# Patient Record
Sex: Male | Born: 1970 | Race: White | Hispanic: No | Smoking: Never smoker
Health system: Southern US, Community
[De-identification: ages and names within clinical notes are randomized; demographics above are authoritative.]

## PROBLEM LIST (undated history)

## (undated) DIAGNOSIS — I10 Essential (primary) hypertension: Secondary | ICD-10-CM

## (undated) DIAGNOSIS — D649 Anemia, unspecified: Secondary | ICD-10-CM

## (undated) DIAGNOSIS — K439 Ventral hernia without obstruction or gangrene: Secondary | ICD-10-CM

## (undated) DIAGNOSIS — G4700 Insomnia, unspecified: Secondary | ICD-10-CM

## (undated) DIAGNOSIS — M1990 Unspecified osteoarthritis, unspecified site: Secondary | ICD-10-CM

## (undated) HISTORY — DX: Essential (primary) hypertension: I10

## (undated) HISTORY — PX: COLONOSCOPY: SHX174

---

## 1998-02-27 HISTORY — PX: BACK SURGERY: SHX140

## 2003-02-28 HISTORY — PX: APPENDECTOMY: SHX54

## 2003-02-28 HISTORY — PX: COLON SURGERY: SHX602

## 2003-12-16 ENCOUNTER — Ambulatory Visit: Payer: Self-pay | Admitting: General Surgery

## 2003-12-16 HISTORY — PX: HERNIA REPAIR: SHX51

## 2003-12-26 ENCOUNTER — Inpatient Hospital Stay: Payer: Self-pay | Admitting: General Surgery

## 2004-11-21 ENCOUNTER — Ambulatory Visit: Payer: Self-pay | Admitting: Internal Medicine

## 2005-01-26 ENCOUNTER — Ambulatory Visit: Payer: Self-pay | Admitting: Internal Medicine

## 2005-06-07 ENCOUNTER — Ambulatory Visit: Payer: Self-pay

## 2005-11-02 ENCOUNTER — Ambulatory Visit: Payer: Self-pay | Admitting: Internal Medicine

## 2005-12-13 ENCOUNTER — Encounter: Payer: Self-pay | Admitting: General Practice

## 2005-12-28 ENCOUNTER — Encounter: Payer: Self-pay | Admitting: General Practice

## 2006-01-27 ENCOUNTER — Encounter: Payer: Self-pay | Admitting: General Practice

## 2006-12-20 ENCOUNTER — Ambulatory Visit: Payer: Self-pay | Admitting: Internal Medicine

## 2007-08-20 ENCOUNTER — Ambulatory Visit: Payer: Self-pay | Admitting: Internal Medicine

## 2007-11-14 ENCOUNTER — Ambulatory Visit: Payer: Self-pay | Admitting: Internal Medicine

## 2009-09-20 ENCOUNTER — Ambulatory Visit: Payer: Self-pay | Admitting: Internal Medicine

## 2009-09-22 ENCOUNTER — Ambulatory Visit: Payer: Self-pay | Admitting: Internal Medicine

## 2010-01-11 ENCOUNTER — Ambulatory Visit: Payer: Self-pay | Admitting: Pain Medicine

## 2010-01-26 ENCOUNTER — Ambulatory Visit: Payer: Self-pay | Admitting: Pain Medicine

## 2010-02-01 ENCOUNTER — Ambulatory Visit: Payer: Self-pay | Admitting: Pain Medicine

## 2010-02-15 ENCOUNTER — Ambulatory Visit: Payer: Self-pay | Admitting: Pain Medicine

## 2010-03-15 ENCOUNTER — Ambulatory Visit: Payer: Self-pay | Admitting: Pain Medicine

## 2014-01-01 ENCOUNTER — Telehealth: Payer: Self-pay | Admitting: *Deleted

## 2014-01-01 NOTE — Telephone Encounter (Signed)
He can come in at noon tomorrow.

## 2014-01-01 NOTE — Telephone Encounter (Signed)
Pts wife called and she wanted to get the patient seen but the only thing you had was on November 18th and she didn't want to wait that long, she said he has a blockage (bowels) but you have never seen him for this, I told her it would probably be best to go to his primary first to see if he really needs to see a surgeon and they can do further testing to see what it really could be. She really didn't like that idea she wanted him to see you. So I didn't know what you wanted me to do about this.

## 2014-01-02 ENCOUNTER — Ambulatory Visit (INDEPENDENT_AMBULATORY_CARE_PROVIDER_SITE_OTHER): Payer: 59 | Admitting: General Surgery

## 2014-01-02 ENCOUNTER — Encounter: Payer: Self-pay | Admitting: General Surgery

## 2014-01-02 VITALS — BP 122/74 | HR 76 | Resp 12 | Ht 74.0 in | Wt 232.0 lb

## 2014-01-02 DIAGNOSIS — K5900 Constipation, unspecified: Secondary | ICD-10-CM | POA: Insufficient documentation

## 2014-01-02 LAB — POC HEMOCCULT BLD/STL (OFFICE/1-CARD/DIAGNOSTIC): Fecal Occult Blood, POC: NEGATIVE

## 2014-01-02 NOTE — Progress Notes (Signed)
Patient ID: Cody [name], male   DOB: [birth year], 43 y.o.   MRN: [id number]  Chief Complaint  Patient presents with  . Other    bowel blockage    HPI Cody Beltonroy A Salas is a 43 y.o. male here today to for a  evaluation of bowel blockage. Patient states he has been very constipation. He states he used an fleet enema last night. He finish had a bowel movement last night.  HPI  No past medical history on file.  Past Surgical History  Procedure Laterality Date  . Back surgery  2000  . Colonoscopy    . Colon surgery  2005    Left colectomy for redundant colon with severe constipation.  Marland Kitchen. Appendectomy  2005    incidental at time of colon surgery.  Marland Kitchen. Hernia repair  12/16/2003    ventral hernia repair with Compsix Kugel mesh    No family history on file.  Social History History  Substance Use Topics  . Smoking status: Never Smoker   . Smokeless tobacco: Never Used  . Alcohol Use: No    No Known Allergies  No current outpatient prescriptions on file.   No current facility-administered medications for this visit.    Review of Systems Review of Systems  Constitutional: Negative.   Respiratory: Negative.   Cardiovascular: Negative.   Gastrointestinal: Positive for constipation.    Blood pressure 122/74, pulse 76, resp. rate 12, height 6\' 2"  (1.88 m), weight 232 lb (105.235 kg).  Physical Exam Physical Exam  Constitutional: He is oriented to person, place, and time. He appears well-developed and well-nourished.  Cardiovascular: Normal rate, regular rhythm and normal heart sounds.   Pulmonary/Chest: Effort normal and breath sounds normal.  Abdominal: Soft. Normal appearance and bowel sounds are normal.    Well healed incision left transverse on the abdomen.   Genitourinary: Rectal exam shows internal hemorrhoid. Rectal exam shows no fissure. Anal tone abnormal: very tight sphincter,anoscope inserted with moderate difficulty, no bleeding. Guaiac negative stool.  Rectal skin  tag  Neurological: He is alert and oriented to person, place, and time.  Skin: Skin is warm and dry.      Assessment    Transient constipation, resolved with OTC laxatives/fleets enema.     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 He's had no symptoms since that time. His description of a sensation that the store wanted to past the anus but could not is somewhat unusual. Pelvic floor dysfunction is uncommon in males. He does have a very small anus and a high sphincter tone. Hopefully with stool bulking he'll have no further episodes of constipation.  Patient to return as needed. The patient will be encouraged to make use of a daily fiber supplement.        Earline MayotteByrnett, Yalissa Fink W 01/02/2014, 7:22 PM

## 2014-01-02 NOTE — Patient Instructions (Signed)
Patient to return as needed. The patient will be encouraged to make use of a daily fiber supplement.  

## 2015-08-04 ENCOUNTER — Encounter: Payer: Self-pay | Admitting: Podiatry

## 2015-08-04 ENCOUNTER — Ambulatory Visit (INDEPENDENT_AMBULATORY_CARE_PROVIDER_SITE_OTHER): Payer: 59 | Admitting: Podiatry

## 2015-08-04 DIAGNOSIS — L601 Onycholysis: Secondary | ICD-10-CM | POA: Diagnosis not present

## 2015-08-04 DIAGNOSIS — L603 Nail dystrophy: Secondary | ICD-10-CM | POA: Diagnosis not present

## 2015-08-04 DIAGNOSIS — M79674 Pain in right toe(s): Secondary | ICD-10-CM

## 2015-08-04 DIAGNOSIS — B351 Tinea unguium: Secondary | ICD-10-CM | POA: Diagnosis not present

## 2015-08-04 NOTE — Progress Notes (Signed)
   Subjective:    Patient ID: Cody [name], male    DOB: Aug 24, 1970, 45 y.o.   MRN: [id number]  HPI  45 year old male presents the also concerns of his right big toenail becoming thick and discolored as well as pre-impression to the second toe due to the thickness the nail. He is requesting nail avulsion at this time. He previously several years ago had a partial nail avulsion eases the toenail grew back this way. The toenail has been removed off several times. Toenails painful with pressure. No surrounding redness or drainage.  Review of Systems  All other systems reviewed and are negative.      Objective:   Physical Exam General: AAO x3, NAD  Dermatological: Right hallux toenails have her trip, dystrophic, discolored and curved putting pressure on the second toe. There is tenderness the toenail. There is no surrounding redness or drainage.  Vascular: Dorsalis Pedis artery and Posterior Tibial artery pedal pulses are 2/4 bilateral with immedate capillary fill time. Pedal hair growth present. No varicosities and no lower extremity edema present bilateral. There is no pain with calf compression, swelling, warmth, erythema.   Neruologic: Grossly intact via light touch bilateral. Vibratory intact via tuning fork bilateral. Protective threshold with Semmes Wienstein monofilament intact to all pedal sites bilateral.  Musculoskeletal: No gross boney pedal deformities bilateral. No pain, crepitus, or limitation noted with foot and ankle range of motion bilateral. Muscular strength 5/5 in all groups tested bilateral.  Gait: Unassisted, Nonantalgic.     Assessment & Plan:  45 year old male right hallux onychodystrophy -Treatment options discussed including all alternatives, risks, and complications -Etiology of symptoms were discussed -At this time, recommended total nail removal without chemical matricectomy to the right hallux. He did not want it removed permanently at this time. Risks and  complications were discussed with the patient for which they understand and  verbally consent to the procedure. Under sterile conditions a total of 3 mL of a mixture of 2% lidocaine plain and 0.5% Marcaine plain was infiltrated in a hallux block fashion. Once anesthetized, the skin was prepped in sterile fashion. A tourniquet was then applied. Next the right hallux nail was excised making sure to remove the entire offending nail border. Once the nail was  Removed, the area was debrided and the underlying skin was intact. The area was irrigated and hemostasis was obtained.  A dry sterile dressing was applied. After application of the dressing the tourniquet was removed and there is found to be an immediate capillary refill time to the digit. The patient tolerated the procedure well any complications. Post procedure instructions were discussed the patient for which he verbally understood. Follow-up in one week for nail check or sooner if any problems are to arise. Discussed signs/symptoms of worsening infection and directed to call the office immediately should any occur or go directly to the emergency room. In the meantime, encouraged to call the office with any questions, concerns, changes symptoms. -Nail sent to [facility]  Matthew Wagoner, DPM

## 2015-08-04 NOTE — Patient Instructions (Signed)

## 2015-08-05 NOTE — Addendum Note (Signed)
Addended by: Hadley PenOX, Joniel Graumann R on: 08/05/2015 07:55 AM   Modules accepted: Orders

## 2015-08-11 ENCOUNTER — Ambulatory Visit: Payer: 59 | Admitting: Podiatry

## 2015-08-18 ENCOUNTER — Ambulatory Visit (INDEPENDENT_AMBULATORY_CARE_PROVIDER_SITE_OTHER): Payer: 59 | Admitting: Podiatry

## 2015-08-18 ENCOUNTER — Encounter: Payer: Self-pay | Admitting: Podiatry

## 2015-08-18 DIAGNOSIS — L603 Nail dystrophy: Secondary | ICD-10-CM

## 2015-08-18 DIAGNOSIS — Z9889 Other specified postprocedural states: Secondary | ICD-10-CM

## 2015-08-18 NOTE — Progress Notes (Signed)
Patient ID: Cody Beltonroy A Hollinshead, male   DOB: September 09, 1970, 45 y.o.   MRN: [id number]  Subjective: Cody [name] is a 45 y.o.  male returns to office today for follow up evaluation after having right Hallux total nail avulsion performed. Patient has been soaking using epsom salts and applying topical antibiotic covered with bandaid up until recently. He states the area has healed and he stopped soaking. Denies any drainage or pus. No redness or red streaks. Patient denies fevers, chills, nausea, vomiting. Denies any calf pain, chest pain, SOB.   Objective:  Vitals: Reviewed  General: Well developed, nourished, in no acute distress, alert and oriented x3   Dermatology: Skin is warm, dry and supple bilateral.  Right hallux nail bedappears to be clean, dry and the wound appears healed. There is no surrounding erythema, edema, drainage/purulence. The remaining nails appear unremarkable at this time. There are no other lesions or other signs of infection present.  Neurovascular status: Intact. No lower extremity swelling; No pain with calf compression bilateral.  Musculoskeletal: Decreased tenderness to palpation of the right hallux nail bed. Muscular strength within normal limits bilateral.   Assesement and Plan: S/p partial nail avulsion, doing well with healed nail bed.   -Awaiting culture results. Once they come in will call the patient.  -Monitor for any reoccurrence and if any issues arise to call the office.  -Monitor for any signs/symptoms of infection. Call the office immediately if any occur or go directly to the emergency room. Call with any questions/concerns.  Ovid CurdMatthew Wagoner, DPM

## 2015-09-08 ENCOUNTER — Ambulatory Visit (INDEPENDENT_AMBULATORY_CARE_PROVIDER_SITE_OTHER): Payer: 59 | Admitting: Podiatry

## 2015-09-08 DIAGNOSIS — Z79899 Other long term (current) drug therapy: Secondary | ICD-10-CM

## 2015-09-08 DIAGNOSIS — B351 Tinea unguium: Secondary | ICD-10-CM

## 2015-09-08 MED ORDER — TERBINAFINE HCL 250 MG PO TABS: 250.0000 mg | ORAL_TABLET | Freq: Every day | ORAL | Status: DC

## 2015-09-08 NOTE — Patient Instructions (Signed)

## 2015-09-12 DIAGNOSIS — B351 Tinea unguium: Secondary | ICD-10-CM | POA: Insufficient documentation

## 2015-09-12 NOTE — Progress Notes (Signed)
Patient ID: Cody [name], male   DOB: 1970/09/08, 45 y.o.   MRN: [id number]  Subjective: 45 year old male presents the office this nail culture results. Denies any acute changes since last pointed. No drainage or pus or any redness or swelling to the toe. No new concerns today. Denies any systemic complaints such as fevers, chills, nausea, vomiting. No acute changes since last appointment, and no other complaints at this time.   Objective: AAO x3, NAD DP/PT pulses palpable bilaterally, CRT less than 3 seconds Seizure site appears to be well-healed. The nails are dystrophic, discolored and mildly hypertrophic. No tenderness to palpation of the nail signs of infection. No areas of pinpoint bony tenderness or pain with vibratory sensation. MMT 5/5, ROM WNL. No edema, erythema, increase in warmth to bilateral lower extremities.  No open lesions or pre-ulcerative lesions.  No pain with calf compression, swelling, warmth, erythema  Assessment: Onychomycosis  Plan: -All treatment options discussed with the patient including all alternatives, risks, complications.  -Discussed culture results of the patient. Discussed treatment options the patient is time elicited proceed with oral therapy. Prescribed Lamisil as well as baseline blood work. Does not start the Lamisil by calling the results the blood work and he verbally understood this. Discussed side effects the medicine and directed to call the office should any occur. Follow-up in 4-6 weeks or sooner if any issues are to arise. -Patient encouraged to call the office with any questions, concerns, change in symptoms.   Ovid CurdMatthew Wagoner, DPM

## 2015-09-15 DIAGNOSIS — Z79899 Other long term (current) drug therapy: Secondary | ICD-10-CM | POA: Diagnosis not present

## 2015-09-15 LAB — CBC WITH DIFFERENTIAL/PLATELET
Basophils Absolute: 0 cells/uL (ref 0–200)
Basophils Relative: 0 %
Eosinophils Absolute: 53 {cells}/uL (ref 15–500)
Eosinophils Relative: 1 %
HCT: 43.6 % (ref 38.5–50.0)
Hemoglobin: 14.4 g/dL (ref 13.2–17.1)
Lymphocytes Relative: 22 %
Lymphs Abs: 1166 cells/uL (ref 850–3900)
MCH: 30.5 pg (ref 27.0–33.0)
MCHC: 33 g/dL (ref 32.0–36.0)
MCV: 92.4 fL (ref 80.0–100.0)
MPV: 9.8 fL (ref 7.5–12.5)
Monocytes Absolute: 424 {cells}/uL (ref 200–950)
Monocytes Relative: 8 %
Neutro Abs: 3657 {cells}/uL (ref 1500–7800)
Neutrophils Relative %: 69 %
Platelets: 292 10*3/uL (ref 140–400)
RBC: 4.72 MIL/uL (ref 4.20–5.80)
RDW: 14.4 % (ref 11.0–15.0)
WBC: 5.3 10*3/uL (ref 3.8–10.8)

## 2015-09-16 LAB — HEPATIC FUNCTION PANEL
ALT: 24 U/L (ref 9–46)
AST: 26 U/L (ref 10–40)
Albumin: 4.3 g/dL (ref 3.6–5.1)
Alkaline Phosphatase: 46 U/L (ref 40–115)
Bilirubin, Direct: 0.2 mg/dL (ref <=0.2)
Indirect Bilirubin: 0.6 mg/dL (ref 0.2–1.2)
Total Bilirubin: 0.8 mg/dL (ref 0.2–1.2)
Total Protein: 6.6 g/dL (ref 6.1–8.1)

## 2015-09-17 ENCOUNTER — Telehealth: Payer: Self-pay | Admitting: *Deleted

## 2015-09-17 NOTE — Telephone Encounter (Addendum)
-----   Message from Vivi BarrackMatthew R Wagoner, DPM sent at 09/17/2015  3:24 PM EDT ----- Labs normal- start lamisil. Please let him know. Informed pt of Dr. Gabriel RungWagoner's orders.

## 2015-09-29 ENCOUNTER — Encounter: Payer: Self-pay | Admitting: Family Medicine

## 2015-09-29 ENCOUNTER — Ambulatory Visit (INDEPENDENT_AMBULATORY_CARE_PROVIDER_SITE_OTHER): Payer: 59 | Admitting: Family Medicine

## 2015-09-29 VITALS — BP 122/90 | HR 64 | Ht 74.0 in | Wt 201.0 lb

## 2015-09-29 DIAGNOSIS — F430 Acute stress reaction: Secondary | ICD-10-CM

## 2015-09-29 DIAGNOSIS — E079 Disorder of thyroid, unspecified: Secondary | ICD-10-CM

## 2015-09-29 DIAGNOSIS — Z7189 Other specified counseling: Secondary | ICD-10-CM | POA: Diagnosis not present

## 2015-09-29 DIAGNOSIS — Z7689 Persons encountering health services in other specified circumstances: Secondary | ICD-10-CM

## 2015-09-29 DIAGNOSIS — R5383 Other fatigue: Secondary | ICD-10-CM | POA: Diagnosis not present

## 2015-09-29 DIAGNOSIS — N529 Male erectile dysfunction, unspecified: Secondary | ICD-10-CM | POA: Diagnosis not present

## 2015-09-29 LAB — HEMOCCULT GUIAC POC 1CARD (OFFICE): Fecal Occult Blood, POC: NEGATIVE

## 2015-09-29 NOTE — Progress Notes (Signed)
Name: Cody [name]   MRN: [id number]    DOB: 01-21-1971   Date:09/29/2015       Progress Note  Subjective  Chief Complaint  Chief Complaint  Patient presents with  . Establish Care  . Fatigue    feeling tired, low sex drive    Thyroid Problem  Presents for initial visit. Symptoms include weight loss. Patient reports no anxiety, cold intolerance, constipation, depressed mood, diaphoresis, diarrhea, dry skin, fatigue, hair loss, heat intolerance, hoarse voice, leg swelling, nail problem, palpitations, tremors, visual change or weight gain. (Weight loss by design) The symptoms have been worsening. Past treatments include nothing. The treatment provided mild relief. His past medical history is significant for neuropathy. There is no history of atrial fibrillation, dementia, diabetes, heart failure, hyperlipidemia, obesity or osteopenia. (Secondary nerve in back)    No problem-specific Assessment & Plan notes found for this encounter.   History reviewed. No pertinent past medical history.  Past Surgical History:  Procedure Laterality Date  . APPENDECTOMY  2005   incidental at time of colon surgery.  Marland Kitchen BACK SURGERY  2000  . COLON SURGERY  2005   Left colectomy for redundant colon with severe constipation.  . COLONOSCOPY    . HERNIA REPAIR  12/16/2003   ventral hernia repair with Compsix Kugel mesh    History reviewed. No pertinent family history.  Social History   Social History  . Marital status: Married    Spouse name: N/A  . Number of children: N/A  . Years of education: N/A   Occupational History  . Not on file.   Social History Main Topics  . Smoking status: Never Smoker  . Smokeless tobacco: Never Used  . Alcohol use No  . Drug use: No  . Sexual activity: Yes   Other Topics Concern  . Not on file   Social History Narrative  . No narrative on file    No Known Allergies   Review of Systems  Constitutional: Positive for weight loss. Negative for chills,  diaphoresis, fatigue, fever, malaise/fatigue and weight gain.  HENT: Negative for ear discharge, ear pain, hoarse voice and sore throat.   Eyes: Negative for blurred vision.  Respiratory: Negative for cough, sputum production, shortness of breath and wheezing.   Cardiovascular: Negative for chest pain, palpitations and leg swelling.  Gastrointestinal: Negative for abdominal pain, blood in stool, constipation, diarrhea, heartburn, melena and nausea.  Genitourinary: Negative for dysuria, frequency, hematuria and urgency.  Musculoskeletal: Positive for back pain and myalgias. Negative for joint pain and neck pain.  Skin: Negative for rash.  Neurological: Negative for dizziness, tingling, tremors, sensory change, focal weakness and headaches.  Endo/Heme/Allergies: Positive for polydipsia. Negative for environmental allergies, cold intolerance and heat intolerance. Does not bruise/bleed easily.  Psychiatric/Behavioral: Negative for depression and suicidal ideas. The patient has insomnia. The patient is not nervous/anxious.      Objective  Vitals:   09/29/15 0834  BP: 122/90  Pulse: 64  Weight: 201 lb (91.2 kg)  Height: 6\' 2"  (1.88 m)    Physical Exam  Constitutional: He is oriented to person, place, and time and well-developed, well-nourished, and in no distress.  HENT:  Head: Normocephalic.  Right Ear: External ear normal.  Left Ear: External ear normal.  Nose: Nose normal.  Mouth/Throat: Oropharynx is clear and moist.  Eyes: Conjunctivae and EOM are normal. Pupils are equal, round, and reactive to light. Right eye exhibits no discharge. Left eye exhibits no discharge. No scleral icterus.  Neck: Normal range of motion. Neck supple. No JVD present. No tracheal deviation present. No thyromegaly present.  Cardiovascular: Normal rate, regular rhythm, normal heart sounds and intact distal pulses.  Exam reveals no gallop and no friction rub.   No murmur heard. Pulmonary/Chest: Breath  sounds normal. No respiratory distress. He has no wheezes. He has no rales.  Abdominal: Soft. Bowel sounds are normal. He exhibits no mass. There is no hepatosplenomegaly. There is no tenderness. There is no rebound, no guarding and no CVA tenderness.  Genitourinary: Rectum normal and prostate normal. Rectal exam shows guaiac negative stool.  Musculoskeletal: Normal range of motion. He exhibits no edema or tenderness.  Lymphadenopathy:    He has no cervical adenopathy.  Neurological: He is alert and oriented to person, place, and time. He has normal sensation, normal strength, normal reflexes and intact cranial nerves. No cranial nerve deficit.  Skin: Skin is warm. No rash noted.  Psychiatric: Memory, affect and judgment normal. His mood appears not anxious. He exhibits a depressed mood. He expresses no suicidal ideation.  Nursing note and vitals reviewed.     Assessment & Plan  Problem List Items Addressed This Visit    None    Visit Diagnoses    Encounter to establish care with new doctor    -  Primary   Other fatigue       Relevant Orders   TSH   Testosterone, Free, Total, SHBG   Renal Function Panel   POCT Occult Blood Stool (Completed)   Erectile dysfunction, unspecified erectile dysfunction type       trial viagra   Relevant Orders   Testosterone, Free, Total, SHBG   Transient situational disturbance            Dr. Hayden [facility]  09/29/15

## 2015-09-30 LAB — RENAL FUNCTION PANEL
Albumin: 4.4 g/dL (ref 3.5–5.5)
BUN/Creatinine Ratio: 12 (ref 9–20)
BUN: 12 mg/dL (ref 6–24)
CO2: 26 mmol/L (ref 18–29)
Calcium: 9.3 mg/dL (ref 8.7–10.2)
Chloride: 103 mmol/L (ref 96–106)
Creatinine, Ser: 1.03 mg/dL (ref 0.76–1.27)
GFR calc Af Amer: 100 mL/min/{1.73_m2} (ref >59)
GFR calc non Af Amer: 86 mL/min/{1.73_m2} (ref >59)
Glucose: 92 mg/dL (ref 65–99)
Phosphorus: 3.1 mg/dL (ref 2.5–4.5)
Potassium: 4.1 mmol/L (ref 3.5–5.2)
Sodium: 143 mmol/L (ref 134–144)

## 2015-09-30 LAB — TSH: TSH: 1.43 u[IU]/mL (ref 0.450–4.500)

## 2015-09-30 LAB — TESTOSTERONE, FREE, TOTAL, SHBG
Sex Hormone Binding: 53.6 nmol/L (ref 16.5–55.9)
Testosterone, Free: 17.8 pg/mL (ref 6.8–21.5)
Testosterone: 866 ng/dL (ref 264–916)

## 2015-10-04 ENCOUNTER — Other Ambulatory Visit: Payer: Self-pay

## 2015-10-12 DIAGNOSIS — H5213 Myopia, bilateral: Secondary | ICD-10-CM | POA: Diagnosis not present

## 2015-10-18 ENCOUNTER — Other Ambulatory Visit: Payer: Self-pay

## 2015-10-18 MED ORDER — SILDENAFIL CITRATE 100 MG PO TABS: 100.0000 mg | ORAL_TABLET | Freq: Every day | ORAL | 0 refills | Status: DC | PRN

## 2015-12-20 ENCOUNTER — Ambulatory Visit: Admission: EM | Admit: 2015-12-20 | Discharge: 2015-12-20 | Discharge status: Absent without leave | Payer: Self-pay

## 2015-12-20 ENCOUNTER — Ambulatory Visit
Admission: EM | Admit: 2015-12-20 | Discharge: 2015-12-20 | Disposition: A | Discharge status: Home / Self Care | Payer: PRIVATE HEALTH INSURANCE | Attending: Family Medicine | Admitting: Family Medicine

## 2015-12-20 ENCOUNTER — Encounter: Payer: Self-pay | Admitting: *Deleted

## 2015-12-20 ENCOUNTER — Ambulatory Visit (INDEPENDENT_AMBULATORY_CARE_PROVIDER_SITE_OTHER): Payer: PRIVATE HEALTH INSURANCE

## 2015-12-20 DIAGNOSIS — S92422A Displaced fracture of distal phalanx of left great toe, initial encounter for closed fracture: Secondary | ICD-10-CM

## 2015-12-20 MED ORDER — NAPROXEN 500 MG PO TABS: 500.0000 mg | ORAL_TABLET | Freq: Two times a day (BID) | ORAL | 0 refills | Status: DC

## 2015-12-20 MED ORDER — OXYCODONE-ACETAMINOPHEN 5-325 MG PO TABS: 1.0000 | ORAL_TABLET | Freq: Every evening | ORAL | 0 refills | Status: DC | PRN

## 2015-12-20 NOTE — ED Provider Notes (Signed)
[facility] ____________________________________________  Time seen: Approximately 1:49 PM  I have reviewed the triage vital signs and the nursing notes.   HISTORY  Chief Complaint Foot Injury  HPI Cody [name] is a 45 y.o. male Cody [name] presenting with a complaint of left great toe pain post injury just prior to arrival. 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 Cody [name] reports he was at work and moving a large bench that weighed greater than 100 pounds, and reports the pains accidentally fell on his left great toe. Denies any other pain or injury. Cody [name] reports he has continued to ambulate but with mild pain to left great toe. Cody [name] states left great toe pain is primarily a throbbing and aching pain. Denies any numbness or tingling sensation. Denies any pain radiation. States pain mostly with direct palpation. Denies any history of issues in left foot in the past. Cody [name] reports he took 1 oral naproxen prior to arrival which has helped with pain.  Cody [name] reports that he did not fall to the ground. Denies head injury or loss consciousness. Denies any other complaints.   History reviewed. No pertinent past medical history.  Cody [name] Active Problem List   Diagnosis Date Noted  . Onychomycosis 09/12/2015  . CN (constipation) 01/02/2014    Past Surgical History:  Procedure Laterality Date  . APPENDECTOMY  2005   incidental at time of colon surgery.  Marland Kitchen. BACK SURGERY  2000  . COLON SURGERY  2005   Left colectomy for redundant colon with severe constipation.  . COLONOSCOPY    . HERNIA REPAIR  12/16/2003   ventral hernia repair with Compsix Kugel mesh    Current Outpatient Rx  . Order #: [id number] Class: Print  . Order #: [id number] Class: Normal  . Order #: [id number] Class: Print  . Order #: [id number] Class: No Print    No current facility-administered medications for this encounter.   Current Outpatient Prescriptions:  .  terbinafine (LAMISIL) 250  MG tablet, Take 1 tablet (250 mg total) by mouth daily., Disp: 90 tablet, Rfl: 0 .  naproxen (NAPROSYN) 500 MG tablet, Take 1 tablet (500 mg total) by mouth 2 (two) times daily., Disp: 30 tablet, Rfl: 0 .  oxyCODONE-acetaminophen (ROXICET) 5-325 MG tablet, Take 1 tablet by mouth at bedtime as needed for moderate pain or severe pain (Do not drive or operate heavy machinery while taking as can cause drowsiness.)., Disp: 9 tablet, Rfl: 0 .  sildenafil (VIAGRA) 100 MG tablet, Take 1 tablet (100 mg total) by mouth daily as needed for erectile dysfunction., Disp: 6 tablet, Rfl: 0  Allergies Review of Cody [name]'s allergies indicates no known allergies.  History reviewed. No pertinent family history.  Social History Social History  Substance Use Topics  . Smoking status: Never Smoker  . Smokeless tobacco: Never Used  . Alcohol use No    Review of Systems Constitutional: No fever/chills Eyes: No visual changes. ENT: No sore throat. Cardiovascular: Denies chest pain. Respiratory: Denies shortness of breath. Gastrointestinal: No abdominal pain.  No nausea, no vomiting.  No diarrhea.  No constipation. Genitourinary: Negative for dysuria. Musculoskeletal: Negative for back pain. Skin: Negative for rash. Neurological: Negative for headaches, focal weakness or numbness.  10-point ROS otherwise negative.  ____________________________________________   PHYSICAL EXAM:  VITAL SIGNS: ED Triage Vitals  Enc Vitals Group     BP 12/20/15 1308 (!) 134/94     Pulse Rate 12/20/15 1308 69     Resp 12/20/15 1308 16     Temp 12/20/15 1308  98 F (36.7 C)     Temp Source 12/20/15 1308 Oral     SpO2 12/20/15 1308 100 %     Weight 12/20/15 1310 210 lb (95.3 kg)     Height 12/20/15 1310 6\' 2"  (1.88 m)     Head Circumference --      Peak Flow --      Pain Score --      Pain Loc --      Pain Edu? --      Excl. in GC? --     Constitutional: Alert and oriented. Well appearing and in no acute  distress. Eyes: Conjunctivae are normal. PERRL. EOMI. ENT      Head: Normocephalic and atraumatic.      Nose: No congestion/rhinnorhea.      Mouth/Throat: Mucous membranes are moist. Cardiovascular: Normal rate, regular rhythm. Grossly normal heart sounds.  Good peripheral circulation. Respiratory: Normal respiratory effort without tachypnea nor retractions. Breath sounds are clear and equal bilaterally. No wheezes/rales/rhonchi.. Gastrointestinal: Soft and nontender. No distention. Normal Bowel sounds. No CVA tenderness. Musculoskeletal:  Nontender with normal range of motion in all extremities. No midline cervical, thoracic or lumbar tenderness to palpation. Bilateral pedal pulses equal and easily palpated. Except: Left great toe distal phalanx moderate tenderness to palpation, moderate swelling, mild to moderate ecchymosis, mild subungual hematoma noted, normal distal sensation and normal distal capillary refill. Left great toe slightly limited distal flexion. Left foot otherwise nontender and with normal sensation. Left lower extremity otherwise nontender. Skin intact.  Neurologic:  Normal speech and language. No gross focal neurologic deficits are appreciated. Speech is normal. No gait instability.  Skin:  Skin is warm, dry and intact. No rash noted. Psychiatric: Mood and affect are normal. Speech and behavior are normal. Cody [name] exhibits appropriate insight and judgment   ___________________________________________   LABS (all labs ordered are listed, but only abnormal results are displayed)  Labs Reviewed - No data to display  RADIOLOGY  Dg Toe Great Left  Result Date: 12/20/2015 CLINICAL DATA:  Dropped heavy object on toe. Pain and bruising of left great toe. EXAM: LEFT GREAT TOE COMPARISON:  None. FINDINGS: There is a comminuted fracture through the mid to distal aspect of the left great toe distal phalanx. No visible intra-articular extension. Overlying soft tissues are intact.  IMPRESSION: Comminuted fracture through the mid to distal aspect of the left great toe distal phalanx. Electronically Signed   By: Charlett Nose M.D.   On: 12/20/2015 14:11   ____________________________________________   PROCEDURES Procedures   Walking boot applied by RN  __________________________________________   INITIAL IMPRESSION / ASSESSMENT AND PLAN / ED COURSE  Pertinent labs & imaging results that were available during my care of the Cody [name] were reviewed by me and considered in my medical decision making (see chart for details).  Very well-appearing Cody [name]. No acute distress. Presents for the complaints of left great toe pain post mechanical injury prior to arrival. Suspect left great toe fracture. Will evaluate x-ray.  Left great toe per radiologist, comminuted fracture through the mid to distal aspect of the left great toe fifth distal phalanx. Discussed and reviewed x-ray with Cody [name]. As great toe, will place Cody [name] in walking boot. Encouraged rest, ice, elevation, limited activity. Work note given that Cody [name] must remain in splint, no climbing or extended walking. Will treat with oral naproxen and when necessary Percocet at night as needed for pain. Encouraged Cody [name] to follow-up with podiatry in the next 2-3 days. Follow-up with Tommie  Maximiano Coss NP as needed as per directed by him resources. Cody [name] to call to schedule. Discussed indication, risks and benefits of medications with Cody [name].  Discussed follow up with Primary care physician this week. Discussed follow up and return parameters including no resolution or any worsening concerns. Cody [name] verbalized understanding and agreed to plan.   ____________________________________________   FINAL CLINICAL IMPRESSION(S) / ED DIAGNOSES  Final diagnoses:  Displaced fracture of distal phalanx of left great toe, initial encounter for closed fracture     Discharge Medication List as of 12/20/2015  2:30 PM    START  taking these medications   Details  oxyCODONE-acetaminophen (ROXICET) 5-325 MG tablet Take 1 tablet by mouth at bedtime as needed for moderate pain or severe pain (Do not drive or operate heavy machinery while taking as can cause drowsiness.)., Starting Mon 12/20/2015, Print        Note: This dictation was prepared with Dragon dictation along with smaller phrase technology. Any transcriptional errors that result from this process are unintentional.    Clinical Course      Renford Dills, NP 12/20/15 2029

## 2015-12-20 NOTE — ED Triage Notes (Signed)
Dropped cement bench on left big toe. Toe appears discolored and edematous.

## 2015-12-20 NOTE — Discharge Instructions (Signed)
Take medication as prescribed. Rest. Ice and elevate.   Follow up with Tommie Maximiano CossAnne Moore and Podiatry this week as discussed.   Follow up with your primary care physician this week as needed. Return to Urgent care for new or worsening concerns.

## 2016-12-05 ENCOUNTER — Ambulatory Visit (INDEPENDENT_AMBULATORY_CARE_PROVIDER_SITE_OTHER): Payer: 59 | Admitting: Family Medicine

## 2016-12-05 ENCOUNTER — Other Ambulatory Visit: Payer: Self-pay

## 2016-12-05 ENCOUNTER — Encounter: Payer: Self-pay | Admitting: Family Medicine

## 2016-12-05 VITALS — BP 126/98 | HR 88 | Ht 74.0 in | Wt 217.0 lb

## 2016-12-05 DIAGNOSIS — Z8739 Personal history of other diseases of the musculoskeletal system and connective tissue: Secondary | ICD-10-CM | POA: Diagnosis not present

## 2016-12-05 DIAGNOSIS — N529 Male erectile dysfunction, unspecified: Secondary | ICD-10-CM | POA: Diagnosis not present

## 2016-12-05 DIAGNOSIS — R030 Elevated blood-pressure reading, without diagnosis of hypertension: Secondary | ICD-10-CM

## 2016-12-05 MED ORDER — SILDENAFIL CITRATE 20 MG PO TABS: 20.0000 mg | ORAL_TABLET | Freq: Three times a day (TID) | ORAL | 3 refills | Status: DC

## 2016-12-05 NOTE — Progress Notes (Signed)
Name: Cody [name]   MRN: [id number]    DOB: 20-Dec-1970   Date:12/05/2016       Progress Note  Subjective  Chief Complaint  Chief Complaint  Patient presents with  . Erectile Dysfunction    wants sildenafil prescribed    Erectile Dysfunction  This is a chronic problem. The current episode started more than 1 year ago. The problem has been waxing and waning since onset. The nature of his difficulty is achieving erection and maintaining erection. He reports no anxiety, decreased libido or performance anxiety. Irritative symptoms do not include frequency, nocturia or urgency. Obstructive symptoms do not include dribbling, incomplete emptying, an intermittent stream, a slower stream, straining or a weak stream. Pertinent negatives include no chills, dysuria, genital pain, hematuria, hesitancy or inability to urinate. Nothing aggravates the symptoms. Past treatments include tadalafil and sildenafil. The treatment provided moderate relief. He has been using treatment for 2 or more years. He has had back pain caused by medications.  Back Pain  This is a chronic problem. The current episode started more than 1 year ago. The problem occurs intermittently. The problem has been waxing and waning since onset. The pain is present in the lumbar spine. The quality of the pain is described as aching. The pain radiates to the right thigh and left thigh. The pain is moderate. Associated symptoms include paresthesias. Pertinent negatives include no abdominal pain, bladder incontinence, bowel incontinence, chest pain, dysuria, fever, headaches, leg pain, numbness, paresis, pelvic pain, perianal numbness, tingling, weakness or weight loss. Risk factors include recent trauma.    No problem-specific Assessment & Plan notes found for this encounter.   No past medical history on file.  Past Surgical History:  Procedure Laterality Date  . APPENDECTOMY  2005   incidental at time of colon surgery.  Marland Kitchen BACK SURGERY   2000  . COLON SURGERY  2005   Left colectomy for redundant colon with severe constipation.  . COLONOSCOPY    . HERNIA REPAIR  12/16/2003   ventral hernia repair with Compsix Kugel mesh    No family history on file.  Social History   Social History  . Marital status: Married    Spouse name: N/A  . Number of children: N/A  . Years of education: N/A   Occupational History  . Not on file.   Social History Main Topics  . Smoking status: Never Smoker  . Smokeless tobacco: Never Used  . Alcohol use No  . Drug use: No  . Sexual activity: Yes   Other Topics Concern  . Not on file   Social History Narrative  . No narrative on file    No Known Allergies  Outpatient Medications Prior to Visit  Medication Sig Dispense Refill  . naproxen (NAPROSYN) 500 MG tablet Take 1 tablet (500 mg total) by mouth 2 (two) times daily. 30 tablet 0  . oxyCODONE-acetaminophen (ROXICET) 5-325 MG tablet Take 1 tablet by mouth at bedtime as needed for moderate pain or severe pain (Do not drive or operate heavy machinery while taking as can cause drowsiness.). 9 tablet 0  . sildenafil (VIAGRA) 100 MG tablet Take 1 tablet (100 mg total) by mouth daily as needed for erectile dysfunction. 6 tablet 0  . terbinafine (LAMISIL) 250 MG tablet Take 1 tablet (250 mg total) by mouth daily. 90 tablet 0   No facility-administered medications prior to visit.     Review of Systems  Constitutional: Negative for chills, fever, malaise/fatigue and weight loss.  HENT: Negative for ear discharge, ear pain and sore throat.   Eyes: Negative for blurred vision.  Respiratory: Negative for cough, sputum production, shortness of breath and wheezing.   Cardiovascular: Negative for chest pain, palpitations and leg swelling.  Gastrointestinal: Negative for abdominal pain, blood in stool, bowel incontinence, constipation, diarrhea, heartburn, melena and nausea.  Genitourinary: Negative for bladder incontinence, decreased  libido, dysuria, frequency, hematuria, hesitancy, incomplete emptying, nocturia, pelvic pain and urgency.  Musculoskeletal: Positive for back pain. Negative for joint pain, myalgias and neck pain.  Skin: Negative for rash.  Neurological: Positive for paresthesias. Negative for dizziness, tingling, sensory change, focal weakness, weakness, numbness and headaches.  Endo/Heme/Allergies: Negative for environmental allergies and polydipsia. Does not bruise/bleed easily.  Psychiatric/Behavioral: Negative for depression and suicidal ideas. The patient is not nervous/anxious and does not have insomnia.      Objective  Vitals:   12/05/16 1551  BP: (!) 126/98  Pulse: 88  Weight: 217 lb (98.4 kg)  Height:  (1.88 m)    Physical Exam  Constitutional: He is oriented to person, place, and time and well-developed, well-nourished, and in no distress.  HENT:  Head: Normocephalic.  Right Ear: External ear normal.  Left Ear: External ear normal.  Nose: Nose normal.  Mouth/Throat: Oropharynx is clear and moist.  Eyes: Pupils are equal, round, and reactive to light. Conjunctivae and EOM are normal. Right eye exhibits no discharge. Left eye exhibits no discharge. No scleral icterus.  Neck: Normal range of motion. Neck supple. No JVD present. No tracheal deviation present. No thyromegaly present.  Cardiovascular: Normal rate, regular rhythm, normal heart sounds and intact distal pulses.  Exam reveals no gallop and no friction rub.   No murmur heard. Pulmonary/Chest: Breath sounds normal. No respiratory distress. He has no wheezes. He has no rales.  Abdominal: Soft. Bowel sounds are normal. He exhibits no mass. There is no hepatosplenomegaly. There is no tenderness. There is no rebound, no guarding and no CVA tenderness.  Musculoskeletal: Normal range of motion. He exhibits no edema or tenderness.  Lymphadenopathy:    He has no cervical adenopathy.  Neurological: He is alert and oriented to person,  place, and time. He has normal sensation, normal strength and intact cranial nerves. No cranial nerve deficit.  Skin: Skin is warm. No rash noted.  Psychiatric: Mood and affect normal.  Nursing note and vitals reviewed.     Assessment & Plan  Problem List Items Addressed This Visit    None    Visit Diagnoses    Vasculogenic erectile dysfunction, unspecified vasculogenic erectile dysfunction type    -  Primary   Relevant Medications   sildenafil (REVATIO) 20 MG tablet   History of herniated intervertebral disc       Elevated systolic blood pressure reading without diagnosis of hypertension          Meds ordered this encounter  Medications  . sildenafil (REVATIO) 20 MG tablet    Sig: Take 1 tablet (20 mg total) by mouth 3 (three) times daily.    Dispense:  90 tablet    Refill:  3      Dr. Elizabeth Sauer [facility]  12/05/16

## 2016-12-05 NOTE — Patient Instructions (Addendum)
DASH Eating Plan DASH stands for "Dietary Approaches to Stop Hypertension." The DASH eating plan is a healthy eating plan that has been shown to reduce high blood pressure (hypertension). It may also reduce your risk for type 2 diabetes, heart disease, and stroke. The DASH eating plan may also help with weight loss. What are tips for following this plan? General guidelines  Avoid eating more than 2,300 mg (milligrams) of salt (sodium) a day. If you have hypertension, you may need to reduce your sodium intake to 1,500 mg a day.  Limit alcohol intake to no more than 1 drink a day for nonpregnant women and 2 drinks a day for men. One drink equals 12 oz of beer, 5 oz of wine, or 1 oz of hard liquor.  Work with your health care provider to maintain a healthy body weight or to lose weight. Ask what an ideal weight is for you.  Get at least 30 minutes of exercise that causes your heart to beat faster (aerobic exercise) most days of the week. Activities may include walking, swimming, or biking.  Work with your health care provider or diet and nutrition specialist (dietitian) to adjust your eating plan to your individual calorie needs. Reading food labels  Check food labels for the amount of sodium per serving. Choose foods with less than 5 percent of the Daily Value of sodium. Generally, foods with less than 300 mg of sodium per serving fit into this eating plan.  To find whole grains, look for the word "whole" as the first word in the ingredient list. Shopping  Buy products labeled as "low-sodium" or "no salt added."  Buy fresh foods. Avoid canned foods and premade or frozen meals. Cooking  Avoid adding salt when cooking. Use salt-free seasonings or herbs instead of table salt or sea salt. Check with your health care provider or pharmacist before using salt substitutes.  Do not fry foods. Cook foods using healthy methods such as baking, boiling, grilling, and broiling instead.  Cook with  heart-healthy oils, such as olive, canola, soybean, or sunflower oil. Meal planning   Eat a balanced diet that includes: ? 5 or more servings of fruits and vegetables each day. At each meal, try to fill half of your plate with fruits and vegetables. ? Up to 6-8 servings of whole grains each day. ? Less than 6 oz of lean meat, poultry, or fish each day. A 3-oz serving of meat is about the same size as a deck of cards. One egg equals 1 oz. ? 2 servings of low-fat dairy each day. ? A serving of nuts, seeds, or beans 5 times each week. ? Heart-healthy fats. Healthy fats called Omega-3 fatty acids are found in foods such as flaxseeds and coldwater fish, like sardines, salmon, and mackerel.  Limit how much you eat of the following: ? Canned or prepackaged foods. ? Food that is high in trans fat, such as fried foods. ? Food that is high in saturated fat, such as fatty meat. ? Sweets, desserts, sugary drinks, and other foods with added sugar. ? Full-fat dairy products.  Do not salt foods before eating.  Try to eat at least 2 vegetarian meals each week.  Eat more home-cooked food and less restaurant, buffet, and fast food.  When eating at a restaurant, ask that your food be prepared with less salt or no salt, if possible. What foods are recommended? The items listed may not be a complete list. Talk with your dietitian about what   dietary choices are best for you. Grains Whole-grain or whole-wheat bread. Whole-grain or whole-wheat pasta. Brown rice. Oatmeal. Quinoa. Bulgur. Whole-grain and low-sodium cereals. Pita bread. Low-fat, low-sodium crackers. Whole-wheat flour tortillas. Vegetables Fresh or frozen vegetables (raw, steamed, roasted, or grilled). Low-sodium or reduced-sodium tomato and vegetable juice. Low-sodium or reduced-sodium tomato sauce and tomato paste. Low-sodium or reduced-sodium canned vegetables. Fruits All fresh, dried, or frozen fruit. Canned fruit in natural juice (without  added sugar). Meat and other protein foods Skinless chicken or turkey. Ground chicken or turkey. Pork with fat trimmed off. Fish and seafood. Egg whites. Dried beans, peas, or lentils. Unsalted nuts, nut butters, and seeds. Unsalted canned beans. Lean cuts of beef with fat trimmed off. Low-sodium, lean deli meat. Dairy Low-fat (1%) or fat-free (skim) milk. Fat-free, low-fat, or reduced-fat cheeses. Nonfat, low-sodium ricotta or cottage cheese. Low-fat or nonfat yogurt. Low-fat, low-sodium cheese. Fats and oils Soft margarine without trans fats. Vegetable oil. Low-fat, reduced-fat, or light mayonnaise and salad dressings (reduced-sodium). Canola, safflower, olive, soybean, and sunflower oils. Avocado. Seasoning and other foods Herbs. Spices. Seasoning mixes without salt. Unsalted popcorn and pretzels. Fat-free sweets. What foods are not recommended? The items listed may not be a complete list. Talk with your dietitian about what dietary choices are best for you. Grains Baked goods made with fat, such as croissants, muffins, or some breads. Dry pasta or rice meal packs. Vegetables Creamed or fried vegetables. Vegetables in a cheese sauce. Regular canned vegetables (not low-sodium or reduced-sodium). Regular canned tomato sauce and paste (not low-sodium or reduced-sodium). Regular tomato and vegetable juice (not low-sodium or reduced-sodium). Pickles. Olives. Fruits Canned fruit in a light or heavy syrup. Fried fruit. Fruit in cream or butter sauce. Meat and other protein foods Fatty cuts of meat. Ribs. Fried meat. Bacon. Sausage. Bologna and other processed lunch meats. Salami. Fatback. Hotdogs. Bratwurst. Salted nuts and seeds. Canned beans with added salt. Canned or smoked fish. Whole eggs or egg yolks. Chicken or turkey with skin. Dairy Whole or 2% milk, cream, and half-and-half. Whole or full-fat cream cheese. Whole-fat or sweetened yogurt. Full-fat cheese. Nondairy creamers. Whipped toppings.  Processed cheese and cheese spreads. Fats and oils Butter. Stick margarine. Lard. Shortening. Ghee. Bacon fat. Tropical oils, such as coconut, palm kernel, or palm oil. Seasoning and other foods Salted popcorn and pretzels. Onion salt, garlic salt, seasoned salt, table salt, and sea salt. Worcestershire sauce. Tartar sauce. Barbecue sauce. Teriyaki sauce. Soy sauce, including reduced-sodium. Steak sauce. Canned and packaged gravies. Fish sauce. Oyster sauce. Cocktail sauce. Horseradish that you find on the shelf. Ketchup. Mustard. Meat flavorings and tenderizers. Bouillon cubes. Hot sauce and Tabasco sauce. Premade or packaged marinades. Premade or packaged taco seasonings. Relishes. Regular salad dressings. Where to find more information:  National Heart, Lung, and Blood Institute: www.nhlbi.nih.gov  American Heart Association: www.heart.org Summary  The DASH eating plan is a healthy eating plan that has been shown to reduce high blood pressure (hypertension). It may also reduce your risk for type 2 diabetes, heart disease, and stroke.  With the DASH eating plan, you should limit salt (sodium) intake to 2,300 mg a day. If you have hypertension, you may need to reduce your sodium intake to 1,500 mg a day.  When on the DASH eating plan, aim to eat more fresh fruits and vegetables, whole grains, lean proteins, low-fat dairy, and heart-healthy fats.  Work with your health care provider or diet and nutrition specialist (dietitian) to adjust your eating plan to your individual   calorie needs. This information is not intended to replace advice given to you by your health care provider. Make sure you discuss any questions you have with your health care provider. Document Released: 02/02/2011 Document Revised: 02/07/2016 Document Reviewed: 02/07/2016 Elsevier Interactive Patient Education  2017 [employer] Sildenafil tablets (Revatio) What is this medicine? SILDENAFIL (sil DEN a fil) is used to  treat pulmonary arterial hypertension. This is a serious heart and lung condition. This medicine helps to improve symptoms and quality of life. This medicine may be used for other purposes; ask your health care provider or pharmacist if you have questions. COMMON BRAND NAME(S): Revatio What should I tell my health care provider before I take this medicine? They need to know if you have any of these conditions: -anatomical deformation of the penis, Peyronie's disease, or history of priapism (painful and prolonged erection) -bleeding disorders -eye disease, vision problems -heart disease -high or low blood pressure -history of blood diseases, like sickle cell anemia or leukemia -kidney disease -liver disease -pulmonary veno-occlusive disease (PVOD) -stomach ulcer -an unusual or allergic reaction to sildenafil, other medicines, foods, dyes, or preservatives -pregnant or trying to get pregnant -breast-feeding How should I use this medicine? Take this medicine by mouth with a glass of water. Follow the directions on the prescription label. You can take it with or without food. If it upsets your stomach, take it with food. Take your doses at regular intervals about 4 to 6 hours apart. Do not take it more often than directed. Do not stop taking except on your doctor's advice. Talk to your pediatrician regarding the use of this medicine in children. This medicine is not approved for use in children. Overdosage: If you think you have taken too much of this medicine contact a poison control center or emergency room at once. NOTE: This medicine is only for you. Do not share this medicine with others. What if I miss a dose? If you miss a dose, take it as soon as you can. If it is almost time for your next dose, take only that dose. Do not take double or extra doses. What may interact with this medicine? Do not take this medicine with any of the following medications: -cisapride -cobicistat -nitrates  like amyl nitrite, isosorbide dinitrate, isosorbide mononitrate, nitroglycerin -riociguat -telaprevir This medicine may also interact with the following medications: -antiviral medicines for HIV or AIDS -bosentan -certain medicines for benign prostatic hyperplasia (BPH) -certain medicines for blood pressure -certain medicines for fungal infections like ketoconazole and itraconazole -cimetidine -erythromycin -rifampin This list may not describe all possible interactions. Give your health care provider a list of all the medicines, herbs, non-prescription drugs, or dietary supplements you use. Also tell them if you smoke, drink alcohol, or use illegal drugs. Some items may interact with your medicine. What should I watch for while using this medicine? Tell your doctor or healthcare professional if your symptoms do not start to get better or if they get worse. Tell your doctor or health care professional right away if you have any change in your eyesight or hearing. You may get dizzy. Do not drive, use machinery, or do anything that needs mental alertness until you know how this medicine affects you. Do not stand or sit up quickly, especially if you are an older patient. This reduces the risk of dizzy or fainting spells. Avoid alcoholic drinks; they can make you more dizzy. What side effects may I notice from receiving this medicine? Side effects that you  should report to your doctor or health care professional as soon as possible: -allergic reactions like skin rash, itching or hives, swelling of the face, lips, or tongue -breathing problems -changes in vision -chest pain -decreased hearing -fast, irregular heartbeat -men: prolonged or painful erection (lasting more than 4 hours) Side effects that usually do not require medical attention (report to your doctor or health care professional if they continue or are bothersome): -facial flushing -headache -nosebleed -trouble sleeping -upset  stomach This list may not describe all possible side effects. Call your doctor for medical advice about side effects. You may report side effects to FDA at 1-800-FDA-1088. Where should I keep my medicine? Keep out of reach of children. Store at room temperature between 15 and 30 degrees C (59 and 86 degrees F). Throw away any unused medicine after the expiration date. NOTE: This sheet is a summary. It may not cover all possible information. If you have questions about this medicine, talk to your doctor, pharmacist, or health care provider.  2018 Elsevier/Gold Standard (2015-01-27 17:18:06)

## 2017-04-12 ENCOUNTER — Ambulatory Visit (INDEPENDENT_AMBULATORY_CARE_PROVIDER_SITE_OTHER): Payer: No Typology Code available for payment source | Admitting: Family Medicine

## 2017-04-12 ENCOUNTER — Encounter: Payer: Self-pay | Admitting: Family Medicine

## 2017-04-12 VITALS — BP 130/78 | HR 72 | Ht 74.0 in | Wt 226.0 lb

## 2017-04-12 DIAGNOSIS — M7521 Bicipital tendinitis, right shoulder: Secondary | ICD-10-CM | POA: Diagnosis not present

## 2017-04-12 DIAGNOSIS — M7702 Medial epicondylitis, left elbow: Secondary | ICD-10-CM

## 2017-04-12 DIAGNOSIS — M7522 Bicipital tendinitis, left shoulder: Secondary | ICD-10-CM

## 2017-04-12 MED ORDER — MELOXICAM 15 MG PO TABS: 15.0000 mg | ORAL_TABLET | Freq: Every day | ORAL | 0 refills | Status: DC

## 2017-04-12 NOTE — Progress Notes (Signed)
Name: Cody [name]   MRN: [id number]    DOB: May 08, 1970   Date:04/12/2017       Progress Note  Subjective  Chief Complaint  Chief Complaint  Patient presents with  . Shoulder Pain    bilateral shoulder and elbow pain- worse on L) side. Started lifting weights x 6 months ago, which has seemed to aggravate situation. L) is worse than R)- keeps him up at night    Shoulder Pain   The pain is present in the left shoulder and right shoulder (L>R). This is a recurrent problem. The current episode started more than 1 year ago. There has been no history of extremity trauma. The problem occurs constantly. The problem has been waxing and waning. The pain is at a severity of 7/10. The pain is moderate. Associated symptoms include a limited range of motion. Pertinent negatives include no fever, inability to bear weight, itching, joint locking, joint swelling, numbness, stiffness or tingling. The symptoms are aggravated by activity. He has tried acetaminophen for the symptoms. The treatment provided mild relief.  Arm Pain   The incident occurred more than 1 week ago. There was no injury mechanism. The pain is present in the left elbow and right elbow (L>R). The pain is at a severity of 7/10. The pain is moderate. The pain has been constant since the incident. Pertinent negatives include no chest pain, numbness or tingling. He has tried acetaminophen for the symptoms.    No problem-specific Assessment & Plan notes found for this encounter.   No past medical history on file.  Past Surgical History:  Procedure Laterality Date  . APPENDECTOMY  2005   incidental at time of colon surgery.  [facility] BACK SURGERY  2000  . COLON SURGERY  2005   Left colectomy for redundant colon with severe constipation.  . COLONOSCOPY    . HERNIA REPAIR  12/16/2003   ventral hernia repair with Compsix Kugel mesh    No family history on file.  Social History   Socioeconomic History  . Marital status: Married    Spouse  name: Not on file  . Number of children: Not on file  . Years of education: Not on file  . Highest education level: Not on file  Social Needs  . Financial resource strain: Not on file  . Food insecurity - worry: Not on file  . Food insecurity - inability: Not on file  . Transportation needs - medical: Not on file  . Transportation needs - non-medical: Not on file  Occupational History  . Not on file  Tobacco Use  . Smoking status: Never Smoker  . Smokeless tobacco: Never Used  Substance and Sexual Activity  . Alcohol use: No    Alcohol/week: 0.0 oz  . Drug use: No  . Sexual activity: Yes  Other Topics Concern  . Not on file  Social History Narrative  . Not on file    No Known Allergies  Outpatient Medications Prior to Visit  Medication Sig Dispense Refill  . sildenafil (REVATIO) 20 MG tablet Take 1 tablet (20 mg total) by mouth 3 (three) times daily. 90 tablet 3   No facility-administered medications prior to visit.     Review of Systems  Constitutional: Negative for chills, fever, malaise/fatigue and weight loss.  HENT: Negative for ear discharge, ear pain and sore throat.   Eyes: Negative for blurred vision.  Respiratory: Negative for cough, sputum production, shortness of breath and wheezing.   Cardiovascular: Negative for chest  pain, palpitations and leg swelling.  Gastrointestinal: Negative for abdominal pain, blood in stool, constipation, diarrhea, heartburn, melena and nausea.  Genitourinary: Negative for dysuria, frequency, hematuria and urgency.  Musculoskeletal: Positive for joint pain and myalgias. Negative for back pain, neck pain and stiffness.  Skin: Negative for itching and rash.  Neurological: Negative for dizziness, tingling, sensory change, focal weakness, numbness and headaches.  Endo/Heme/Allergies: Negative for environmental allergies and polydipsia. Does not bruise/bleed easily.  Psychiatric/Behavioral: Negative for depression and suicidal ideas.  The patient is not nervous/anxious and does not have insomnia.      Objective  Vitals:   04/12/17 0916  BP: 130/78  Pulse: 72  Weight: 226 lb (102.5 kg)  Height: 6\' 2"  (1.88 m)    Physical Exam  Constitutional: He is well-developed, well-nourished, and in no distress.  HENT:  Head: Normocephalic.  Right Ear: External ear normal.  Left Ear: External ear normal.  Nose: Nose normal.  Mouth/Throat: Oropharynx is clear and moist.  Eyes: Conjunctivae and EOM are normal. Pupils are equal, round, and reactive to light. Right eye exhibits no discharge. Left eye exhibits no discharge. No scleral icterus.  Neck: Normal range of motion. Neck supple. No JVD present. No tracheal deviation present. No thyromegaly present.  Cardiovascular: Normal rate, regular rhythm, normal heart sounds and intact distal pulses. Exam reveals no gallop and no friction rub.  No murmur heard. Pulmonary/Chest: Breath sounds normal. No respiratory distress. He has no wheezes. He has no rales.  Abdominal: Soft. Bowel sounds are normal. He exhibits no mass. There is no hepatosplenomegaly. There is no tenderness. There is no rebound, no guarding and no CVA tenderness.  Musculoskeletal: He exhibits no edema.       Right shoulder: He exhibits tenderness.       Left shoulder: He exhibits tenderness.       Right elbow: Tenderness found. Medial epicondyle tenderness noted.       Left elbow: Tenderness found. Medial epicondyle tenderness noted.       Cervical back: Normal.  biceps  Lymphadenopathy:    He has no cervical adenopathy.  Neurological: He is alert. He has normal sensation, normal strength, normal reflexes and intact cranial nerves. No cranial nerve deficit.  Skin: Skin is warm. No rash noted.  Psychiatric: Mood and affect normal.  Nursing note and vitals reviewed.     Assessment & Plan  Problem List Items Addressed This Visit    None    Visit Diagnoses    Epicondylitis elbow, medial, left    -   Primary   Relevant Medications   meloxicam (MOBIC) 15 MG tablet   Biceps tendonitis of both shoulders       Relevant Medications   meloxicam (MOBIC) 15 MG tablet      Meds ordered this encounter  Medications  . meloxicam (MOBIC) 15 MG tablet    Sig: Take 1 tablet (15 mg total) by mouth daily.    Dispense:  30 tablet    Refill:  0      Dr. Hayden [facility]  04/12/17

## 2017-04-12 NOTE — Patient Instructions (Signed)
Golfer's Elbow Golfer's elbow, also called medial epicondylitis, is a condition that results from inflammation of the strong bands of tissue (tendons) that attach your forearm muscles to the inside of your bone at the elbow. These tendons affect the muscles that bend the palm toward the wrist (flexion). This condition is called golfer's elbow because it is more common among people who constantly bend and twist their wrists, such as golfers. This injury usually results from overuse. Tendons also become less flexible with age. This condition causes elbow pain that may spread to your forearm and upper arm. The pain may get worse when you bend your wrist downward. What are the causes? This condition is an overuse injury that is caused by:  Repeatedly flexing, turning, or twisting your wrist.  Constantly gripping objects with your hands.  What increases the risk? This condition is more likely to develop in people who play golf or tennis or have jobs that require the constant use of their hands. This injury is more common among:  Carpenters.  Gardeners.  Musicians.  Bricklayers.  Typists.  What are the signs or symptoms? Symptoms of this condition include:  Pain near the inner elbow or forearm.  Reduced grip strength.  How is this diagnosed? This condition is diagnosed based on your symptoms, medical history, and physical exam. During the exam, your health care provider may test your grip strength and move your wrist to check for pain. You may also have an MRI to confirm the diagnosis, look for other issues, and check for tears in the ligaments, muscles, or tendons. How is this treated? Treatment for this condition includes:  Stopping all activities that make you bend or twist your wrist until your pain and other symptoms go away.  Icing your wrist to relieve pain.  Taking NSAIDs or getting corticosteroid injections to reduce pain and swelling.  Doing stretches, range-of-motion,  and strengthening exercises (physical therapy) as told by your health care provider.  In rare cases, surgery may be needed if your condition does not improve. Follow these instructions at home:  If directed, apply ice to the injured area. ? Put ice in a plastic bag. ? Place a towel between your skin and the bag. ? Leave the ice on for 20 minutes, 2-3 times a day.  Move your fingers often to avoid stiffness.  Raise (elevate) the injured area above the level of your heart while you are sitting or lying down.  Return to your normal activities as told by your health care provider. Ask your health care provider what activities are safe for you.  Do exercises as told by your health care provider.  Do not use tobacco products, including cigarettes, chewing tobacco, or e-cigarettes. If you need help quitting, ask your health care provider.  Take over-the-counter and prescription medicines only as told by your health care provider.  Keep all follow-up visits as told by your health care provider. This is important. How is this prevented?  Warm up and stretch before being active.  Cool down and stretch after being active.  Give your body time to rest between periods of activity.  Make sure to use equipment that fits you.  Be safe and responsible while being active to avoid falls.  Do at least 150 minutes of moderate-intensity exercise each week, such as brisk walking or water aerobics.  Maintain physical fitness, including: ? Strength. ? Flexibility. ? Cardiovascular fitness. ? Endurance.  Perform exercises to strengthen the forearm muscles.  Slow your golf   swing to reduce shock in the arm when making contact with the ball, if you play golf. Contact a health care provider if:  Your pain does not improve or it gets worse.  You notice numbness in your hand. Get help right away if:  Your pain is severe.  You cannot move your wrist. This information is not intended to replace  advice given to you by your health care provider. Make sure you discuss any questions you have with your health care provider. Document Released: 02/13/2005 Document Revised: 10/19/2015 Document Reviewed: 10/26/2014 Elsevier Interactive Patient Education  2018 [employer] Climber's Elbow Climber's elbow is an elbow injury that results from long-lasting (chronic) inflammation or strain of muscles in your upper arms (brachialis muscles). Your brachialis muscle helps your arm bend at the elbow. This is a common overuse injury among climbers who constantly overstretch or flex their arms over their heads. This condition is also called brachialis or anterior capsule elbow strain. This type of injury develops gradually over time (overuse injury) from repeated overstretching (overextension) or repetitive, forceful flexing of your upper arm muscles. This causes chronic inflammation in one or both muscles. Strained muscles may be twisted, pulled, or torn. The tissue that connects these muscles to bone (tendons) may also become inflamed (tendinitis). This condition causes pain that gets worse over time and it makes it difficult to straighten your arm. What are the causes? This condition is caused by chronic inflammation of the upper arm muscles from continuous overhead arm motion. What increases the risk? This condition is more likely to develop in people who have a job or play a sport that requires constant overhead arm motions that involve repetitive flexing and extending of the elbow. This includes athletes who:  Climb.  Swim.  Play tennis or baseball.  What are the signs or symptoms? Symptoms of this condition include:  Pain in the upper arm and elbow.  Arm pain that gets worse while climbing.  Muscle spasm in the upper arm.  Loss of arm strength.  How is this diagnosed? This condition is diagnosed based on your symptoms, a medical history, and physical exam. During the exam, you may be  asked to move your hand, fingers, wrist, and elbow in certain ways to help your health care provider find the source of your injury. You may also have imaging studies to confirm the diagnosis and find out more about your condition, such as:  X-rays to check for broken bones.  An MRI to check for tears in the ligaments, muscles, or tendons.  How is this treated? Treatment for this condition includes:  Stopping all activities that cause pain until your symptoms go away.  Icing your elbow to relieve pain.  Taking NSAIDs to reduce pain and swelling.  Doing exercises (physical therapy) as told by your health care provider.  Follow these instructions at home: Managing pain, stiffness, and swelling  If directed, apply ice to the injured area. ? Put ice in a plastic bag. ? Place a towel between your skin and the bag. ? Leave the ice on for 20 minutes, 2-3 times a day.  Move your fingers often to avoid stiffness and to lessen swelling.  Raise (elevate) the injured area above the level of your heart while you are sitting or lying down. Activity  Return to your normal activities as told by your health care provider. Ask your health care provider what activities are safe for you.  Do exercises as told by your health care  provider. General instructions  Do not use any tobacco products, including cigarettes, chewing tobacco, or e-cigarettes. If you need help quitting, ask your health care provider.  Take over-the-counter and prescription medicines only as told by your health care provider.  Keep all follow-up visits as told by your health care provider. This is important. How is this prevented?  Warm up and stretch before being active.  Cool down and stretch after being active.  Give your body time to rest between periods of activity.  Make sure to use equipment that fits you.  Be safe and responsible while being active to avoid falls.  Maintain physical fitness,  including: ? Strength. ? Flexibility. ? Cardiovascular fitness. ? Endurance. Contact a health care provider if:  Your pain does not improve or it gets worse. Get help right away if:  You have severe pain, swelling, or numbness in your arm or hand.  You cannot move your arm.  Your elbow looks like it has the wrong shape (looks deformed) or it feels like it is sliding out of place. This information is not intended to replace advice given to you by your health care provider. Make sure you discuss any questions you have with your health care provider. Document Released: 02/13/2005 Document Revised: 10/21/2015 Document Reviewed: 11/17/2014 Elsevier Interactive Patient Education  [employer]2018 [employer]

## 2017-04-19 ENCOUNTER — Other Ambulatory Visit: Payer: Self-pay

## 2017-04-19 DIAGNOSIS — M7522 Bicipital tendinitis, left shoulder: Secondary | ICD-10-CM

## 2017-04-20 ENCOUNTER — Other Ambulatory Visit: Payer: Self-pay | Admitting: Sports Medicine

## 2017-04-20 DIAGNOSIS — M7542 Impingement syndrome of left shoulder: Secondary | ICD-10-CM

## 2017-04-20 DIAGNOSIS — M25511 Pain in right shoulder: Principal | ICD-10-CM

## 2017-04-20 DIAGNOSIS — G8929 Other chronic pain: Secondary | ICD-10-CM

## 2017-04-20 DIAGNOSIS — M25512 Pain in left shoulder: Principal | ICD-10-CM

## 2017-05-02 ENCOUNTER — Ambulatory Visit
Admission: RE | Admit: 2017-05-02 | Discharge: 2017-05-02 | Disposition: A | Discharge status: Home / Self Care | Payer: No Typology Code available for payment source | Source: Ambulatory Visit | Attending: Sports Medicine | Admitting: Sports Medicine

## 2017-05-02 DIAGNOSIS — M12812 Other specific arthropathies, not elsewhere classified, left shoulder: Secondary | ICD-10-CM | POA: Diagnosis not present

## 2017-05-02 DIAGNOSIS — M25512 Pain in left shoulder: Secondary | ICD-10-CM | POA: Diagnosis not present

## 2017-05-02 DIAGNOSIS — M25511 Pain in right shoulder: Secondary | ICD-10-CM | POA: Insufficient documentation

## 2017-05-02 DIAGNOSIS — M7552 Bursitis of left shoulder: Secondary | ICD-10-CM | POA: Insufficient documentation

## 2017-05-02 DIAGNOSIS — M7542 Impingement syndrome of left shoulder: Secondary | ICD-10-CM | POA: Insufficient documentation

## 2017-05-02 DIAGNOSIS — G8929 Other chronic pain: Secondary | ICD-10-CM | POA: Insufficient documentation

## 2017-05-18 ENCOUNTER — Encounter: Payer: Self-pay | Admitting: Family Medicine

## 2017-05-18 ENCOUNTER — Ambulatory Visit (INDEPENDENT_AMBULATORY_CARE_PROVIDER_SITE_OTHER): Payer: No Typology Code available for payment source | Admitting: Family Medicine

## 2017-05-18 VITALS — BP 138/98 | HR 100 | Ht 74.0 in | Wt 224.0 lb

## 2017-05-18 DIAGNOSIS — Z0000 Encounter for general adult medical examination without abnormal findings: Secondary | ICD-10-CM

## 2017-05-18 DIAGNOSIS — Z1211 Encounter for screening for malignant neoplasm of colon: Secondary | ICD-10-CM

## 2017-05-18 LAB — HEMOCCULT GUIAC POC 1CARD (OFFICE): Fecal Occult Blood, POC: NEGATIVE

## 2017-05-18 NOTE — Progress Notes (Signed)
Name: Cody [name]   MRN: [id number]    DOB: 12-26-1970   Date:05/18/2017       Progress Note  Subjective  Chief Complaint  Chief Complaint  Patient presents with  . Annual Exam    no issues    Patient presents for annual physical exam.   No problem-specific Assessment & Plan notes found for this encounter.   History reviewed. No pertinent past medical history.  Past Surgical History:  Procedure Laterality Date  . APPENDECTOMY  2005   incidental at time of colon surgery.  Marland Kitchen BACK SURGERY  2000  . COLON SURGERY  2005   Left colectomy for redundant colon with severe constipation.  . COLONOSCOPY    . HERNIA REPAIR  12/16/2003   ventral hernia repair with Compsix Kugel mesh    History reviewed. No pertinent family history.  Social History   Socioeconomic History  . Marital status: Divorced    Spouse name: Not on file  . Number of children: Not on file  . Years of education: Not on file  . Highest education level: Not on file  Occupational History  . Not on file  Social Needs  . Financial resource strain: Not on file  . Food insecurity:    Worry: Not on file    Inability: Not on file  . Transportation needs:    Medical: Not on file    Non-medical: Not on file  Tobacco Use  . Smoking status: Never Smoker  . Smokeless tobacco: Never Used  Substance and Sexual Activity  . Alcohol use: No    Alcohol/week: 0.0 oz  . Drug use: No  . Sexual activity: Yes  Lifestyle  . Physical activity:    Days per week: Not on file    Minutes per session: Not on file  . Stress: Not on file  Relationships  . Social connections:    Talks on phone: Not on file    Gets together: Not on file    Attends religious service: Not on file    Active member of club or organization: Not on file    Attends meetings of clubs or organizations: Not on file    Relationship status: Not on file  . Intimate partner violence:    Fear of current or ex partner: Not on file    Emotionally abused:  Not on file    Physically abused: Not on file    Forced sexual activity: Not on file  Other Topics Concern  . Not on file  Social History Narrative  . Not on file    No Known Allergies  Outpatient Medications Prior to Visit  Medication Sig Dispense Refill  . sildenafil (REVATIO) 20 MG tablet Take 1 tablet (20 mg total) by mouth 3 (three) times daily. 90 tablet 3  . meloxicam (MOBIC) 15 MG tablet Take 1 tablet (15 mg total) by mouth daily. (Patient not taking: Reported on 05/18/2017) 30 tablet 0   No facility-administered medications prior to visit.     Review of Systems  Constitutional: Negative for chills, fever, malaise/fatigue and weight loss.  HENT: Negative for ear discharge, ear pain and sore throat.   Eyes: Negative for blurred vision.  Respiratory: Negative for cough, sputum production, shortness of breath and wheezing.   Cardiovascular: Negative for chest pain, palpitations and leg swelling.  Gastrointestinal: Negative for abdominal pain, blood in stool, constipation, diarrhea, heartburn, melena and nausea.  Genitourinary: Negative for dysuria, frequency, hematuria and urgency.  Musculoskeletal: Positive for  joint pain. Negative for back pain, myalgias and neck pain.  Skin: Negative for rash.  Neurological: Negative for dizziness, tingling, sensory change, focal weakness and headaches.  Endo/Heme/Allergies: Negative for environmental allergies and polydipsia. Does not bruise/bleed easily.  Psychiatric/Behavioral: Negative for depression and suicidal ideas. The patient is not nervous/anxious and does not have insomnia.      Objective  Vitals:   05/18/17 0832  BP: (!) 138/98  Pulse: 100  Weight: 224 lb (101.6 kg)  Height: 6\' 2"  (1.88 m)    Physical Exam  Constitutional: He is oriented to person, place, and time and well-developed, well-nourished, and in no distress. Vital signs are normal.  HENT:  Head: Normocephalic.  Right Ear: Hearing, tympanic membrane,  external ear and ear canal normal.  Left Ear: Hearing, tympanic membrane, external ear and ear canal normal.  Nose: Nose normal. No mucosal edema. Right sinus exhibits no maxillary sinus tenderness. Left sinus exhibits no maxillary sinus tenderness.  Mouth/Throat: Uvula is midline, oropharynx is clear and moist and mucous membranes are normal. No oropharyngeal exudate, posterior oropharyngeal edema or posterior oropharyngeal erythema.  Eyes: Pupils are equal, round, and reactive to light. Conjunctivae, EOM and lids are normal. Right eye exhibits no discharge. Left eye exhibits no discharge. No scleral icterus.  Fundoscopic exam:      The right eye shows no arteriolar narrowing and no AV nicking.       The left eye shows no arteriolar narrowing and no AV nicking.  Neck: Trachea normal and normal range of motion. Neck supple. Normal carotid pulses, no hepatojugular reflux and no JVD present. No spinous process tenderness present. Carotid bruit is not present. No tracheal deviation present. No thyroid mass and no thyromegaly present.  Cardiovascular: Normal rate, regular rhythm, S1 normal, S2 normal, normal heart sounds and intact distal pulses. PMI is not displaced. Exam reveals no gallop, no S3, no S4, no friction rub and no decreased pulses.  No murmur heard. Pulmonary/Chest: Effort normal and breath sounds normal. No respiratory distress. He has no wheezes. He has no rales.  Abdominal: Soft. Normal aorta and bowel sounds are normal. He exhibits no mass. There is no hepatosplenomegaly. There is no tenderness. There is no rebound, no guarding and no CVA tenderness. A hernia is present. Hernia confirmed positive in the umbilical area.  Genitourinary: Rectum normal, prostate normal and testes/scrotum normal. Rectal exam shows no external hemorrhoid and guaiac negative stool. Prostate is not enlarged.  Musculoskeletal: Normal range of motion. He exhibits no edema or tenderness.       Lumbar back: Normal.   Lymphadenopathy:       Head (right side): No submandibular adenopathy present.       Head (left side): No submandibular adenopathy present.    He has no cervical adenopathy.  Neurological: He is alert and oriented to person, place, and time. He has normal sensation, normal strength, normal reflexes and intact cranial nerves. No cranial nerve deficit.  Skin: Skin is warm and intact. No rash noted.  Psychiatric: Mood and affect normal.  Nursing note and vitals reviewed.     Assessment & Plan  Problem List Items Addressed This Visit    None    Visit Diagnoses    Annual physical exam    -  Primary   Relevant Orders   Renal function panel   Lipid panel   Colon cancer screening       Relevant Orders   POCT occult blood stool (Completed)  No orders of the defined types were placed in this encounter.     Dr. Hayden [facility]  05/18/17

## 2017-05-19 LAB — RENAL FUNCTION PANEL
Albumin: 4.6 g/dL (ref 3.5–5.5)
BUN/Creatinine Ratio: 14 (ref 9–20)
BUN: 21 mg/dL (ref 6–24)
CO2: 22 mmol/L (ref 20–29)
Calcium: 9.9 mg/dL (ref 8.7–10.2)
Chloride: 105 mmol/L (ref 96–106)
Creatinine, Ser: 1.51 mg/dL — ABNORMAL HIGH (ref 0.76–1.27)
GFR calc Af Amer: 62 mL/min/{1.73_m2} (ref >59)
GFR calc non Af Amer: 54 mL/min/{1.73_m2} — ABNORMAL LOW (ref >59)
Glucose: 91 mg/dL (ref 65–99)
Phosphorus: 3.1 mg/dL (ref 2.5–4.5)
Potassium: 4.4 mmol/L (ref 3.5–5.2)
Sodium: 144 mmol/L (ref 134–144)

## 2017-05-19 LAB — LIPID PANEL
Chol/HDL Ratio: 3.7 ratio (ref 0.0–5.0)
Cholesterol, Total: 197 mg/dL (ref 100–199)
HDL: 53 mg/dL (ref >39)
LDL Calculated: 131 mg/dL — ABNORMAL HIGH (ref 0–99)
Triglycerides: 65 mg/dL (ref 0–149)
VLDL Cholesterol Cal: 13 mg/dL (ref 5–40)

## 2017-05-23 ENCOUNTER — Other Ambulatory Visit: Payer: Self-pay

## 2017-09-24 ENCOUNTER — Encounter: Payer: Self-pay | Admitting: Family Medicine

## 2017-09-24 ENCOUNTER — Ambulatory Visit (INDEPENDENT_AMBULATORY_CARE_PROVIDER_SITE_OTHER): Payer: No Typology Code available for payment source | Admitting: Family Medicine

## 2017-09-24 VITALS — BP 138/92 | HR 88 | Ht 74.0 in | Wt 220.0 lb

## 2017-09-24 DIAGNOSIS — I10 Essential (primary) hypertension: Secondary | ICD-10-CM

## 2017-09-24 MED ORDER — LISINOPRIL 10 MG PO TABS: 10.0000 mg | ORAL_TABLET | Freq: Every day | ORAL | 0 refills | Status: DC

## 2017-09-24 NOTE — Progress Notes (Signed)
Name: Cody [name]   MRN: [id number]    DOB: Apr 05, 1970   Date:09/24/2017       Progress Note  Subjective  Chief Complaint  Chief Complaint  Patient presents with  . Hypertension    recheck for elevated B/P    Hypertension  This is a new problem. The current episode started more than 1 year ago. The problem has been waxing and waning since onset. The problem is uncontrolled. Pertinent negatives include no anxiety, blurred vision, chest pain, headaches, malaise/fatigue, neck pain, orthopnea, palpitations, peripheral edema, PND, shortness of breath or sweats. There are no associated agents to hypertension. There are no known risk factors for coronary artery disease. Past treatments include lifestyle changes. The current treatment provides no improvement. There are no compliance problems.  There is no history of angina, kidney disease, CAD/MI, CVA, heart failure, left ventricular hypertrophy, PVD or retinopathy. There is no history of chronic renal disease, a hypertension causing med or renovascular disease.    No problem-specific Assessment & Plan notes found for this encounter.   No past medical history on file.  Past Surgical History:  Procedure Laterality Date  . APPENDECTOMY  2005   incidental at time of colon surgery.  Marland Kitchen. BACK SURGERY  2000  . COLON SURGERY  2005   Left colectomy for redundant colon with severe constipation.  . COLONOSCOPY    . HERNIA REPAIR  12/16/2003   ventral hernia repair with Compsix Kugel mesh    No family history on file.  Social History   Socioeconomic History  . Marital status: Divorced    Spouse name: Not on file  . Number of children: Not on file  . Years of education: Not on file  . Highest education level: Not on file  Occupational History  . Not on file  Social Needs  . Financial resource strain: Not on file  . Food insecurity:    Worry: Not on file    Inability: Not on file  . Transportation needs:    Medical: Not on file   Non-medical: Not on file  Tobacco Use  . Smoking status: Never Smoker  . Smokeless tobacco: Never Used  Substance and Sexual Activity  . Alcohol use: No    Alcohol/week: 0.0 oz  . Drug use: No  . Sexual activity: Yes  Lifestyle  . Physical activity:    Days per week: Not on file    Minutes per session: Not on file  . Stress: Not on file  Relationships  . Social connections:    Talks on phone: Not on file    Gets together: Not on file    Attends religious service: Not on file    Active member of club or organization: Not on file    Attends meetings of clubs or organizations: Not on file    Relationship status: Not on file  . Intimate partner violence:    Fear of current or ex partner: Not on file    Emotionally abused: Not on file    Physically abused: Not on file    Forced sexual activity: Not on file  Other Topics Concern  . Not on file  Social History Narrative  . Not on file    No Known Allergies  Outpatient Medications Prior to Visit  Medication Sig Dispense Refill  . meloxicam (MOBIC) 15 MG tablet Take 1 tablet (15 mg total) by mouth daily. (Patient not taking: Reported on 05/18/2017) 30 tablet 0  . sildenafil (REVATIO) 20  MG tablet Take 1 tablet (20 mg total) by mouth 3 (three) times daily. 90 tablet 3   No facility-administered medications prior to visit.     Review of Systems  Constitutional: Negative for chills, fever, malaise/fatigue and weight loss.  HENT: Negative for congestion, ear discharge, ear pain, sore throat and tinnitus.   Eyes: Negative for blurred vision.  Respiratory: Negative for cough, sputum production, shortness of breath and wheezing.   Cardiovascular: Negative for chest pain, palpitations, orthopnea, leg swelling and PND.  Gastrointestinal: Negative for abdominal pain, blood in stool, constipation, diarrhea, heartburn, melena and nausea.  Genitourinary: Negative for dysuria, frequency, hematuria and urgency.  Musculoskeletal: Negative  for back pain, joint pain, myalgias and neck pain.  Skin: Negative for rash.  Neurological: Negative for dizziness, tingling, sensory change, focal weakness and headaches.  Endo/Heme/Allergies: Negative for environmental allergies and polydipsia. Does not bruise/bleed easily.  Psychiatric/Behavioral: Negative for depression and suicidal ideas. The patient is not nervous/anxious and does not have insomnia.      Objective  Vitals:   09/24/17 0819  BP: (!) 138/92  Pulse: 88  Weight: 220 lb (99.8 kg)  Height: 6\' 2"  (1.88 m)    Physical Exam  Constitutional: He is oriented to person, place, and time.  HENT:  Head: Normocephalic.  Right Ear: External ear normal.  Left Ear: External ear normal.  Nose: Nose normal.  Mouth/Throat: Oropharynx is clear and moist.  Eyes: Pupils are equal, round, and reactive to light. Conjunctivae and EOM are normal. Right eye exhibits no discharge. Left eye exhibits no discharge. No scleral icterus.  Neck: Normal range of motion. Neck supple. No JVD present. No tracheal deviation present. No thyromegaly present.  Cardiovascular: Normal rate, regular rhythm, normal heart sounds and intact distal pulses. Exam reveals no gallop and no friction rub.  No murmur heard. Pulmonary/Chest: Breath sounds normal. No respiratory distress. He has no wheezes. He has no rales.  Abdominal: Soft. Bowel sounds are normal. He exhibits no mass. There is no hepatosplenomegaly. There is no tenderness. There is no rebound, no guarding and no CVA tenderness.  Musculoskeletal: Normal range of motion. He exhibits no edema or tenderness.  Lymphadenopathy:    He has no cervical adenopathy.  Neurological: He is alert and oriented to person, place, and time. He has normal strength and normal reflexes. No cranial nerve deficit.  Skin: Skin is warm. No rash noted.  Nursing note and vitals reviewed.     Assessment & Plan  Problem List Items Addressed This Visit    None    Visit  Diagnoses    Essential hypertension    -  Primary   New. Recurrent elevated blood pressure with positive family history. Will initiate lisinopril 10 mg and will recheck with labs in 6 weeks.   Relevant Medications   lisinopril (PRINIVIL,ZESTRIL) 10 MG tablet      Meds ordered this encounter  Medications  . lisinopril (PRINIVIL,ZESTRIL) 10 MG tablet    Sig: Take 1 tablet (10 mg total) by mouth daily.    Dispense:  90 tablet    Refill:  0      Dr. Elizabeth Sauer [facility]  09/24/17

## 2017-10-31 ENCOUNTER — Encounter: Payer: Self-pay | Admitting: Family Medicine

## 2017-10-31 ENCOUNTER — Ambulatory Visit (INDEPENDENT_AMBULATORY_CARE_PROVIDER_SITE_OTHER): Payer: No Typology Code available for payment source | Admitting: Family Medicine

## 2017-10-31 VITALS — BP 104/70 | HR 72 | Ht 74.0 in | Wt 223.0 lb

## 2017-10-31 DIAGNOSIS — R5381 Other malaise: Secondary | ICD-10-CM | POA: Diagnosis not present

## 2017-10-31 DIAGNOSIS — Z23 Encounter for immunization: Secondary | ICD-10-CM

## 2017-10-31 DIAGNOSIS — I10 Essential (primary) hypertension: Secondary | ICD-10-CM | POA: Diagnosis not present

## 2017-10-31 DIAGNOSIS — R5383 Other fatigue: Secondary | ICD-10-CM | POA: Diagnosis not present

## 2017-10-31 MED ORDER — LISINOPRIL 5 MG PO TABS: 5.0000 mg | ORAL_TABLET | Freq: Every day | ORAL | 1 refills | Status: DC

## 2017-10-31 NOTE — Progress Notes (Signed)
Name: Cody [name]   MRN: [id number]    DOB: 02-20-1971   Date:10/31/2017       Progress Note  Subjective  Chief Complaint  Chief Complaint  Patient presents with  . Hypertension    follow up on B/P med- started it at last visit    Hypertension  This is a chronic problem. The current episode started more than 1 year ago. The problem is unchanged. The problem is controlled. Associated symptoms include malaise/fatigue and shortness of breath. Pertinent negatives include no anxiety, blurred vision, chest pain, headaches, neck pain, orthopnea, palpitations, peripheral edema, PND or sweats. There are no associated agents to hypertension. There are no known risk factors for coronary artery disease. Past treatments include ACE inhibitors. The current treatment provides significant improvement. There are no compliance problems.  There is no history of angina, kidney disease, CAD/MI, CVA, heart failure, left ventricular hypertrophy, PVD or retinopathy. Identifiable causes of hypertension include a thyroid problem. There is no history of chronic renal disease.  Thyroid Problem  Presents for follow-up visit. Patient reports no anxiety, cold intolerance, constipation, depressed mood, diaphoresis, diarrhea, dry skin, fatigue, hair loss, heat intolerance, hoarse voice, leg swelling, nail problem, palpitations, tremors, visual change, weight gain or weight loss. There is no history of heart failure.    No problem-specific Assessment & Plan notes found for this encounter.   History reviewed. No pertinent past medical history.  Past Surgical History:  Procedure Laterality Date  . APPENDECTOMY  2005   incidental at time of colon surgery.  Marland Kitchen BACK SURGERY  2000  . COLON SURGERY  2005   Left colectomy for redundant colon with severe constipation.  . COLONOSCOPY    . HERNIA REPAIR  12/16/2003   ventral hernia repair with Compsix Kugel mesh    History reviewed. No pertinent family history.  Social  History   Socioeconomic History  . Marital status: Divorced    Spouse name: Not on file  . Number of children: Not on file  . Years of education: Not on file  . Highest education level: Not on file  Occupational History  . Not on file  Social Needs  . Financial resource strain: Not on file  . Food insecurity:    Worry: Not on file    Inability: Not on file  . Transportation needs:    Medical: Not on file    Non-medical: Not on file  Tobacco Use  . Smoking status: Never Smoker  . Smokeless tobacco: Never Used  Substance and Sexual Activity  . Alcohol use: No    Alcohol/week: 0.0 standard drinks  . Drug use: No  . Sexual activity: Yes  Lifestyle  . Physical activity:    Days per week: Not on file    Minutes per session: Not on file  . Stress: Not on file  Relationships  . Social connections:    Talks on phone: Not on file    Gets together: Not on file    Attends religious service: Not on file    Active member of club or organization: Not on file    Attends meetings of clubs or organizations: Not on file    Relationship status: Not on file  . Intimate partner violence:    Fear of current or ex partner: Not on file    Emotionally abused: Not on file    Physically abused: Not on file    Forced sexual activity: Not on file  Other Topics Concern  .  Not on file  Social History Narrative  . Not on file    No Known Allergies  Outpatient Medications Prior to Visit  Medication Sig Dispense Refill  . lisinopril (PRINIVIL,ZESTRIL) 10 MG tablet Take 1 tablet (10 mg total) by mouth daily. 90 tablet 0   No facility-administered medications prior to visit.     Review of Systems  Constitutional: Positive for malaise/fatigue. Negative for chills, diaphoresis, fatigue, fever, weight gain and weight loss.  HENT: Negative for ear discharge, ear pain, hoarse voice and sore throat.   Eyes: Negative for blurred vision.  Respiratory: Positive for shortness of breath. Negative for  cough, sputum production and wheezing.   Cardiovascular: Negative for chest pain, palpitations, orthopnea, leg swelling and PND.  Gastrointestinal: Negative for abdominal pain, blood in stool, constipation, diarrhea, heartburn, melena and nausea.  Genitourinary: Negative for dysuria, frequency, hematuria and urgency.  Musculoskeletal: Negative for back pain, joint pain, myalgias and neck pain.  Skin: Negative for rash.  Neurological: Negative for dizziness, tingling, tremors, sensory change, focal weakness and headaches.  Endo/Heme/Allergies: Negative for environmental allergies, cold intolerance, heat intolerance and polydipsia. Does not bruise/bleed easily.  Psychiatric/Behavioral: Negative for depression and suicidal ideas. The patient is not nervous/anxious and does not have insomnia.      Objective  Vitals:   10/31/17 0830  BP: 104/70  Pulse: 72  Weight: 223 lb (101.2 kg)  Height: 6\' 2"  (1.88 m)    Physical Exam  Constitutional: He is oriented to person, place, and time.  HENT:  Head: Normocephalic.  Right Ear: External ear normal.  Left Ear: External ear normal.  Nose: Nose normal.  Mouth/Throat: Oropharynx is clear and moist.  Eyes: Pupils are equal, round, and reactive to light. Conjunctivae and EOM are normal. Right eye exhibits no discharge. Left eye exhibits no discharge. No scleral icterus.  Neck: Normal range of motion. Neck supple. No JVD present. No tracheal deviation present. No thyromegaly present.  Cardiovascular: Normal rate, regular rhythm, normal heart sounds and intact distal pulses. Exam reveals no gallop and no friction rub.  No murmur heard. Pulmonary/Chest: Breath sounds normal. No respiratory distress. He has no wheezes. He has no rales.  Abdominal: Soft. Bowel sounds are normal. He exhibits no mass. There is no hepatosplenomegaly. There is no tenderness. There is no rebound, no guarding and no CVA tenderness.  Musculoskeletal: Normal range of motion. He  exhibits no edema or tenderness.  Lymphadenopathy:    He has no cervical adenopathy.  Neurological: He is alert and oriented to person, place, and time. He has normal strength and normal reflexes. No cranial nerve deficit.  Skin: Skin is warm. No rash noted.  Nursing note and vitals reviewed.     Assessment & Plan  Problem List Items Addressed This Visit    None    Visit Diagnoses    Malaise and fatigue    -  Primary   draw renal/TSH and CBC   Relevant Orders   Renal Function Panel   TSH   CBC   Essential hypertension       drop Lisinopril to 5mg - recheck in 4-6 weeks   Relevant Medications   lisinopril (PRINIVIL,ZESTRIL) 5 MG tablet   Other Relevant Orders   Renal Function Panel   Flu vaccine need       flu vaccine administered   Relevant Orders   Flu Vaccine QUAD 6+ mos PF IM (Fluarix Quad PF) (Completed)      Meds ordered this encounter  Medications  .  lisinopril (PRINIVIL,ZESTRIL) 5 MG tablet    Sig: Take 1 tablet (5 mg total) by mouth daily.    Dispense:  90 tablet    Refill:  1      Dr. Elizabeth S[facility]  10/31/17

## 2017-11-01 LAB — RENAL FUNCTION PANEL
Albumin: 4.7 g/dL (ref 3.5–5.5)
BUN/Creatinine Ratio: 16 (ref 9–20)
BUN: 21 mg/dL (ref 6–24)
CO2: 20 mmol/L (ref 20–29)
Calcium: 9.6 mg/dL (ref 8.7–10.2)
Chloride: 100 mmol/L (ref 96–106)
Creatinine, Ser: 1.32 mg/dL — ABNORMAL HIGH (ref 0.76–1.27)
GFR calc Af Amer: 73 mL/min/{1.73_m2} (ref >59)
GFR calc non Af Amer: 63 mL/min/{1.73_m2} (ref >59)
Glucose: 97 mg/dL (ref 65–99)
Phosphorus: 3.7 mg/dL (ref 2.5–4.5)
Potassium: 4.5 mmol/L (ref 3.5–5.2)
Sodium: 137 mmol/L (ref 134–144)

## 2017-11-01 LAB — CBC
Hematocrit: 45.9 % (ref 37.5–51.0)
Hemoglobin: 15.7 g/dL (ref 13.0–17.7)
MCH: 29.7 pg (ref 26.6–33.0)
MCHC: 34.2 g/dL (ref 31.5–35.7)
MCV: 87 fL (ref 79–97)
Platelets: 300 10*3/uL (ref 150–450)
RBC: 5.28 x10E6/uL (ref 4.14–5.80)
RDW: 13.1 % (ref 12.3–15.4)
WBC: 5.2 10*3/uL (ref 3.4–10.8)

## 2017-11-01 LAB — TSH: TSH: 2.72 u[IU]/mL (ref 0.450–4.500)

## 2020-04-27 ENCOUNTER — Other Ambulatory Visit (HOSPITAL_COMMUNITY): Payer: Self-pay | Admitting: Family Medicine

## 2020-04-27 ENCOUNTER — Encounter: Payer: Self-pay | Admitting: Family Medicine

## 2020-04-27 ENCOUNTER — Ambulatory Visit (INDEPENDENT_AMBULATORY_CARE_PROVIDER_SITE_OTHER): Payer: No Typology Code available for payment source | Admitting: Family Medicine

## 2020-04-27 ENCOUNTER — Other Ambulatory Visit: Payer: Self-pay

## 2020-04-27 VITALS — BP 140/94 | HR 80 | Ht 74.0 in | Wt 239.0 lb

## 2020-04-27 DIAGNOSIS — Z23 Encounter for immunization: Secondary | ICD-10-CM

## 2020-04-27 DIAGNOSIS — Z1211 Encounter for screening for malignant neoplasm of colon: Secondary | ICD-10-CM | POA: Diagnosis not present

## 2020-04-27 DIAGNOSIS — Z0000 Encounter for general adult medical examination without abnormal findings: Secondary | ICD-10-CM

## 2020-04-27 DIAGNOSIS — M7071 Other bursitis of hip, right hip: Secondary | ICD-10-CM | POA: Diagnosis not present

## 2020-04-27 DIAGNOSIS — M5136 Other intervertebral disc degeneration, lumbar region: Secondary | ICD-10-CM

## 2020-04-27 DIAGNOSIS — I10 Essential (primary) hypertension: Secondary | ICD-10-CM | POA: Diagnosis not present

## 2020-04-27 DIAGNOSIS — N529 Male erectile dysfunction, unspecified: Secondary | ICD-10-CM

## 2020-04-27 LAB — HEMOCCULT GUIAC POC 1CARD (OFFICE): Fecal Occult Blood, POC: NEGATIVE

## 2020-04-27 MED ORDER — SILDENAFIL CITRATE 100 MG PO TABS: 100.0000 mg | ORAL_TABLET | Freq: Every day | ORAL | 5 refills | Status: DC | PRN

## 2020-04-27 MED ORDER — MELOXICAM 15 MG PO TABS: 15.0000 mg | ORAL_TABLET | Freq: Every day | ORAL | 5 refills | Status: DC

## 2020-04-27 MED ORDER — CYCLOBENZAPRINE HCL 10 MG PO TABS: 10.0000 mg | ORAL_TABLET | Freq: Three times a day (TID) | ORAL | 5 refills | Status: DC | PRN

## 2020-04-27 MED ORDER — VALSARTAN 80 MG PO TABS: 80.0000 mg | ORAL_TABLET | Freq: Every day | ORAL | 1 refills | Status: DC

## 2020-04-27 NOTE — Progress Notes (Signed)
Date:  04/27/2020   Name:  Cody [name]   DOB:  Sep 26, 1970   MRN:  [id number]   Chief Complaint: Annual Exam and Hip Pain (R) hip pain when running)  Patient is a 50 year old male who presents for a comprehensive physical exam. The patient reports the following problems: right hip pain. Health maintenance has been reviewed up to date.  Hip Pain  The incident occurred more than 1 week ago. There was no injury mechanism. The pain is present in the left hip. The quality of the pain is described as aching. The pain is moderate. Pertinent negatives include no inability to bear weight, loss of motion, muscle weakness, numbness or tingling. Exacerbated by: running. He has tried nothing for the symptoms. The treatment provided moderate relief.    Lab Results  Component Value Date   CREATININE 1.32 (H) 10/31/2017   BUN 21 10/31/2017   NA 137 10/31/2017   K 4.5 10/31/2017   CL 100 10/31/2017   CO2 20 10/31/2017   Lab Results  Component Value Date   CHOL 197 05/18/2017   HDL 53 05/18/2017   LDLCALC 131 (H) 05/18/2017   TRIG 65 05/18/2017   CHOLHDL 3.7 05/18/2017   Lab Results  Component Value Date   TSH 2.720 10/31/2017   No results found for: HGBA1C Lab Results  Component Value Date   WBC 5.2 10/31/2017   HGB 15.7 10/31/2017   HCT 45.9 10/31/2017   MCV 87 10/31/2017   PLT 300 10/31/2017   Lab Results  Component Value Date   ALT 24 09/15/2015   AST 26 09/15/2015   ALKPHOS 46 09/15/2015   BILITOT 0.8 09/15/2015     Review of Systems  Constitutional: Negative for chills and fever.  HENT: Negative for drooling, ear discharge, ear pain and sore throat.   Respiratory: Negative for cough, shortness of breath and wheezing.   Cardiovascular: Negative for chest pain, palpitations and leg swelling.  Gastrointestinal: Negative for abdominal pain, blood in stool, constipation, diarrhea and nausea.  Endocrine: Negative for polydipsia.  Genitourinary: Negative for dysuria,  frequency, hematuria and urgency.  Musculoskeletal: Negative for back pain, myalgias and neck pain.  Skin: Negative for rash.  Allergic/Immunologic: Negative for environmental allergies.  Neurological: Negative for dizziness, tingling, numbness and headaches.  Hematological: Does not bruise/bleed easily.  Psychiatric/Behavioral: Negative for suicidal ideas. The patient is not nervous/anxious.     Patient Active Problem List   Diagnosis Date Noted  . Onychomycosis 09/12/2015  . CN (constipation) 01/02/2014    No Known Allergies  Past Surgical History:  Procedure Laterality Date  . APPENDECTOMY  2005   incidental at time of colon surgery.  Marland Kitchen BACK SURGERY  2000  . COLON SURGERY  2005   Left colectomy for redundant colon with severe constipation.  . COLONOSCOPY    . HERNIA REPAIR  12/16/2003   ventral hernia repair with Compsix Kugel mesh    Social History   Tobacco Use  . Smoking status: Never Smoker  . Smokeless tobacco: Never Used  Substance Use Topics  . Alcohol use: No    Alcohol/week: 0.0 standard drinks  . Drug use: No     Medication list has been reviewed and updated.  Current Meds  Medication Sig  . [DISCONTINUED] lisinopril (PRINIVIL,ZESTRIL) 5 MG tablet Take 1 tablet (5 mg total) by mouth daily.    PHQ 2/9 Scores 04/27/2020 12/05/2016  PHQ - 2 Score 0 0  PHQ- 9 Score 0  0    GAD 7 : Generalized Anxiety Score 04/27/2020  Nervous, Anxious, on Edge 0  Control/stop worrying 0  Worry too much - different things 0  Trouble relaxing 0  Restless 0  Easily annoyed or irritable 0  Afraid - awful might happen 0  Total GAD 7 Score 0    BP Readings from Last 3 Encounters:  04/27/20 (!) 140/94  10/31/17 104/70  09/24/17 (!) 138/92    Physical Exam Vitals and nursing note reviewed.  Constitutional:      Appearance: Normal appearance. He is well-groomed and overweight.  HENT:     Head: Normocephalic.     Right Ear: Hearing, tympanic membrane, ear canal and  external ear normal.     Left Ear: Hearing, tympanic membrane, ear canal and external ear normal.     Nose: Nose normal. No congestion or rhinorrhea.     Mouth/Throat:     Lips: Pink.     Mouth: Oropharynx is clear and moist. Mucous membranes are moist.     Tongue: No lesions.     Palate: No lesions.     Pharynx: Oropharynx is clear. Uvula midline.  Eyes:     General: Lids are normal. Vision grossly intact. Gaze aligned appropriately. No scleral icterus.       Right eye: No discharge.        Left eye: No discharge.     Extraocular Movements: Extraocular movements intact and EOM normal.     Conjunctiva/sclera: Conjunctivae normal.     Pupils: Pupils are equal, round, and reactive to light.     Funduscopic exam:    Right eye: Red reflex present.        Left eye: Red reflex present. Neck:     Thyroid: No thyroid mass, thyromegaly or thyroid tenderness.     Vascular: Normal carotid pulses. No carotid bruit, hepatojugular reflux or JVD.     Trachea: Trachea normal. No tracheal deviation.  Cardiovascular:     Rate and Rhythm: Normal rate and regular rhythm.     Chest Wall: PMI is not displaced.     Pulses: Normal pulses and intact distal pulses.     Heart sounds: Normal heart sounds, S1 normal and S2 normal. No murmur heard.  No systolic murmur is present.  No diastolic murmur is present. No friction rub. No gallop. No S3 or S4 sounds.   Pulmonary:     Effort: Pulmonary effort is normal. No respiratory distress.     Breath sounds: Normal breath sounds. No decreased breath sounds, wheezing, rhonchi or rales.  Chest:     Chest wall: No mass.  Breasts: Breasts are symmetrical.     Right: Normal. No axillary adenopathy or supraclavicular adenopathy.     Left: Normal. No axillary adenopathy or supraclavicular adenopathy.    Abdominal:     General: Bowel sounds are normal.     Palpations: Abdomen is soft. There is no hepatomegaly, splenomegaly, hepatosplenomegaly or mass.      Tenderness: There is no abdominal tenderness. There is no CVA tenderness, guarding or rebound.     Hernia: A hernia is present. Hernia is present in the umbilical area. There is no hernia in the left inguinal area.  Genitourinary:    Penis: Normal.      Testes: Normal.        Right: Mass not present.     Epididymis:     Right: Normal.     Left: Normal.  Musculoskeletal:  General: No edema. Normal range of motion.     Cervical back: Full passive range of motion without pain, normal range of motion and neck supple.     Lumbar back: No deformity.     Right hip: Tenderness present. Normal strength.     Left hip: Normal strength.     Comments: Tender right ischial tuberosity/laminectony scar  Lymphadenopathy:     Head:     Right side of head: No submental or submandibular adenopathy.     Left side of head: No submental or submandibular adenopathy.     Cervical: No cervical adenopathy.     Right cervical: No superficial, deep or posterior cervical adenopathy.    Left cervical: No superficial, deep or posterior cervical adenopathy.     Upper Body:     Right upper body: No supraclavicular or axillary adenopathy.     Left upper body: No supraclavicular or axillary adenopathy.     Lower Body: No right inguinal adenopathy. No left inguinal adenopathy.  Skin:    General: Skin is warm.     Capillary Refill: Capillary refill takes less than 2 seconds.     Findings: No rash.  Neurological:     Mental Status: He is alert and oriented to person, place, and time.     Cranial Nerves: Cranial nerves are intact. No cranial nerve deficit.     Sensory: Sensation is intact.     Motor: Motor function is intact.     Deep Tendon Reflexes: Strength normal and reflexes are normal and symmetric.  Psychiatric:        Behavior: Behavior is cooperative.     Wt Readings from Last 3 Encounters:  04/27/20 239 lb (108.4 kg)  10/31/17 223 lb (101.2 kg)  09/24/17 220 lb (99.8 kg)    BP (!) 140/94    Pulse 80   Ht 6\' 2"  (1.88 m)   Wt 239 lb (108.4 kg)   BMI 30.69 kg/m   Assessment and Plan: 1. Annual physical exam Noted subjective/objective concerns noted during history and physical exam.  Patient's chart was reviewed for most recent encounters, labs, imaging, and care everywhere.Khalin Royce Huser is a 50 y.o. male who presents today for his Complete Annual Exam. He feels well. He reports exercising . He reports he is sleeping well. Immunizations are reviewed and recommendations provided.   Age appropriate screening tests are discussed. Counseling given for risk factor reduction interventions.  We will obtain a renal function panel lipid panel at this time.  We will recheck blood pressure in 4 months. - Renal Function Panel - Lipid Panel With LDL/HDL Ratio  2. Ischial bursitis of right side Chronic.  Controlled.  Stable.  Patient has tenderness over the ischial tuberosity.  This may either be attachment of extensors of the hip versus tendinitis.  We will treat with meloxicam 15 mg once a day. - meloxicam (MOBIC) 15 MG tablet; Take 1 tablet (15 mg total) by mouth daily.  Dispense: 30 tablet; Refill: 5  3. Degenerative disc disease, lumbar Patient has laminectomy scar consistent with previous lumbar degenerative disc disease.  Patient would like something during the day and we will prescribe meloxicam 15 mg once a day. - meloxicam (MOBIC) 15 MG tablet; Take 1 tablet (15 mg total) by mouth daily.  Dispense: 30 tablet; Refill: 5 - cyclobenzaprine (FLEXERIL) 10 MG tablet; Take 1 tablet (10 mg total) by mouth 3 (three) times daily as needed for muscle spasms.  Dispense: 30 tablet; Refill: 5  4. Primary hypertension Chronic.  Recurrent.  Persistent.  Patient has been on medication in the past and we are going to resume with valsartan 80 mg once a day.  Will recheck blood pressure in 6 weeks. - valsartan (DIOVAN) 80 MG tablet; Take 1 tablet (80 mg total) by mouth daily.  Dispense: 60 tablet; Refill:  1  5. Vasculogenic erectile dysfunction, unspecified vasculogenic erectile dysfunction type Chronic.  Uncontrolled.  We will prescribe sildenafil 100 mg 1/2-1 as needed as needed - sildenafil (VIAGRA) 100 MG tablet; Take 1 tablet (100 mg total) by mouth daily as needed for erectile dysfunction.  Dispense: 10 tablet; Refill: 5  6. Colon cancer screening Patient will be screened for colon cancer with referral to gastroenterology for colonoscopy.  Guaiac was noted to be negative today. - Ambulatory referral to Gastroenterology - POCT Occult Blood Stool  7. Need for diphtheria-tetanus-pertussis (Tdap) vaccine Discussion and administration. - Tdap vaccine greater than or equal to 7yo IM

## 2020-04-28 LAB — RENAL FUNCTION PANEL
Albumin: 4.7 g/dL (ref 3.8–4.9)
BUN/Creatinine Ratio: 13 (ref 9–20)
BUN: 15 mg/dL (ref 6–24)
CO2: 21 mmol/L (ref 20–29)
Calcium: 9.5 mg/dL (ref 8.7–10.2)
Chloride: 104 mmol/L (ref 96–106)
Creatinine, Ser: 1.16 mg/dL (ref 0.76–1.27)
Glucose: 99 mg/dL (ref 65–99)
Phosphorus: 3.1 mg/dL (ref 2.8–4.1)
Potassium: 4.7 mmol/L (ref 3.5–5.2)
Sodium: 141 mmol/L (ref 134–144)
eGFR: 76 mL/min/{1.73_m2} (ref >59)

## 2020-04-28 LAB — LIPID PANEL WITH LDL/HDL RATIO
Cholesterol, Total: 263 mg/dL — ABNORMAL HIGH (ref 100–199)
HDL: 47 mg/dL (ref >39)
LDL Chol Calc (NIH): 201 mg/dL — ABNORMAL HIGH (ref 0–99)
LDL/HDL Ratio: 4.3 ratio — ABNORMAL HIGH (ref 0.0–3.6)
Triglycerides: 86 mg/dL (ref 0–149)
VLDL Cholesterol Cal: 15 mg/dL (ref 5–40)

## 2020-05-27 ENCOUNTER — Other Ambulatory Visit (HOSPITAL_COMMUNITY): Payer: Self-pay | Admitting: General Surgery

## 2020-05-28 ENCOUNTER — Other Ambulatory Visit: Payer: Self-pay | Admitting: General Surgery

## 2020-05-28 NOTE — Progress Notes (Signed)
Subjective:     Patient ID: Margues Filippini is a 50 y.o. male.  HPI  The following portions of the patient's history were reviewed and updated as appropriate.  This an established patient is here today for: office visit. Patient is here today to discuss a colonoscopy. The patient reports his last colonoscopy was done by Dr. Lemar Livings. Patient reports bowel movements are every day. He denies any blood in the stools.  The patient reports that his anal opening is small and he has to use significant pressure to defecate.  He does move his bowels every day.  He is making use of what he described as a nonlaxative product by [employer] to help with regular elimination.  He has had episodes where he notices "gurgling" which he thought was reminiscent of what he had experienced prior to his 2005 colon resection for a very redundant colon.       Chief Complaint  Patient presents with  . Colonoscopy    referral     BP (!) 140/90   Pulse 95   Temp 36.2 C (97.1 F)   Ht 188 cm (6\' 2" )   Wt (!) 108.4 kg (239 lb)   SpO2 95%   BMI 30.69 kg/m   History reviewed. No pertinent past medical history.   Past Surgical History:  Procedure Laterality Date  . APPENDECTOMY  2005  . BACK SURGERY  2000  . colon surgery  2005   part of colon removed because it was twisted  . HERNIA REPAIR  12/16/2003   ventral       Social History          Socioeconomic History  . Marital status: Divorced    Spouse name: Not on file  . Number of children: Not on file  . Years of education: Not on file  . Highest education level: Not on file  Occupational History  . Not on file  Tobacco Use  . Smoking status: Never Smoker  . Smokeless tobacco: Never Used  Substance and Sexual Activity  . Alcohol use: No  . Drug use: Never  . Sexual activity: Yes  Other Topics Concern  . Not on file  Social History Narrative  . Not on file   Social Determinants of Health   Financial Resource  Strain: Not on file  Food Insecurity: Not on file  Transportation Needs: Not on file       No Known Allergies  Current Medications        Current Outpatient Medications  Medication Sig Dispense Refill  . cyclobenzaprine (FLEXERIL) 10 MG tablet Take 10 mg by mouth 3 (three) times daily as needed    . sildenafiL (VIAGRA) 100 MG tablet     . valsartan (DIOVAN) 80 MG tablet     . meloxicam (MOBIC) 15 MG tablet Take by mouth (Patient not taking: Reported on 05/27/2020  )    . sildenafil, antihypertensive, (REVATIO) 20 mg tablet Take by mouth (Patient not taking: Reported on 05/27/2020  )     No current facility-administered medications for this visit.      History reviewed. No pertinent family history.    Review of Systems  Constitutional: Negative for chills and fever.  Respiratory: Negative for cough.        Objective:   Physical Exam Exam conducted with a chaperone present.  Constitutional:      Appearance: Normal appearance.  Cardiovascular:     Rate and Rhythm: Normal rate and regular rhythm.  Pulses: Normal pulses.     Heart sounds: Normal heart sounds.  Pulmonary:     Effort: Pulmonary effort is normal.     Breath sounds: Normal breath sounds.  Abdominal:     General: Abdomen is protuberant. A surgical scar is present. Bowel sounds are increased.     Tenderness: There is no abdominal tenderness.     Hernia: No hernia is present.       Comments: Well-healed incision from extended left colectomy completed in 2005 followed by ventral hernia repair in October 2005.  No evidence of recurrence. No abdominal bruits.  Neurological:     Mental Status: He is alert and oriented to person, place, and time.  Psychiatric:        Mood and Affect: Mood normal.        Behavior: Behavior normal.        Assessment:     Candidate for screening colonoscopy.    Plan:     Considering his reports of somewhat slow illumination although daily  stools, will make use of a 2-day prep.  Considering his previous colonic surgery, colonoscopy is felt to be a better screening test for colorectal cancer than Cologuard.  The patient did have a negative stool Hemoccult April 27, 2020.  Patient to be scheduled for a colonoscopy at a convenient date. He will make use of Dulcolax and Miralax for the prep and has been asked to be on clear liquids two days prior.     Entered by Wendall Stade, CMA, acting as a scribe for Dr. Donnalee Curry, MD.   The documentation recorded by the scribe accurately reflects the service I personally performed and the decisions made by me.   Earline Mayotte, MD FACS

## 2020-05-31 ENCOUNTER — Other Ambulatory Visit: Payer: Self-pay

## 2020-05-31 MED FILL — Meloxicam Tab 15 MG: ORAL | 30 days supply | Qty: 30 | Fill #0 | Status: AC

## 2020-06-07 ENCOUNTER — Other Ambulatory Visit: Payer: No Typology Code available for payment source | Attending: General Surgery

## 2020-06-08 ENCOUNTER — Encounter: Payer: Self-pay | Admitting: General Surgery

## 2020-06-09 ENCOUNTER — Ambulatory Visit
Admission: RE | Admit: 2020-06-09 | Discharge: 2020-06-09 | Disposition: A | Discharge status: Home / Self Care | Payer: No Typology Code available for payment source | Attending: General Surgery | Admitting: General Surgery

## 2020-06-09 ENCOUNTER — Ambulatory Visit: Payer: No Typology Code available for payment source | Admitting: Certified Registered Nurse Anesthetist

## 2020-06-09 ENCOUNTER — Encounter
Admission: RE | Disposition: A | Discharge status: Home / Self Care | Payer: Self-pay | Source: Home / Self Care | Attending: General Surgery

## 2020-06-09 ENCOUNTER — Other Ambulatory Visit: Payer: Self-pay

## 2020-06-09 ENCOUNTER — Other Ambulatory Visit: Payer: Self-pay | Admitting: General Surgery

## 2020-06-09 ENCOUNTER — Encounter: Payer: Self-pay | Admitting: General Surgery

## 2020-06-09 DIAGNOSIS — Z9049 Acquired absence of other specified parts of digestive tract: Secondary | ICD-10-CM | POA: Insufficient documentation

## 2020-06-09 DIAGNOSIS — Z1211 Encounter for screening for malignant neoplasm of colon: Secondary | ICD-10-CM | POA: Diagnosis present

## 2020-06-09 DIAGNOSIS — Z791 Long term (current) use of non-steroidal anti-inflammatories (NSAID): Secondary | ICD-10-CM | POA: Diagnosis not present

## 2020-06-09 DIAGNOSIS — Z8719 Personal history of other diseases of the digestive system: Secondary | ICD-10-CM | POA: Insufficient documentation

## 2020-06-09 DIAGNOSIS — R1033 Periumbilical pain: Secondary | ICD-10-CM

## 2020-06-09 HISTORY — PX: COLONOSCOPY WITH PROPOFOL: SHX5780

## 2020-06-09 SURGERY — COLONOSCOPY WITH PROPOFOL
Anesthesia: General

## 2020-06-09 MED ORDER — LIDOCAINE HCL (CARDIAC) PF 100 MG/5ML IV SOSY
PREFILLED_SYRINGE | INTRAVENOUS | Status: DC | PRN
  Administered 2020-06-09: 50 mg via INTRAVENOUS

## 2020-06-09 MED ORDER — PROPOFOL 500 MG/50ML IV EMUL
INTRAVENOUS | Status: AC
  Filled 2020-06-09: qty 50

## 2020-06-09 MED ORDER — SODIUM CHLORIDE 0.9 % IV SOLN: INTRAVENOUS | Status: DC

## 2020-06-09 MED ORDER — LIDOCAINE HCL (PF) 2 % IJ SOLN
INTRAMUSCULAR | Status: AC
  Filled 2020-06-09: qty 5

## 2020-06-09 MED ORDER — PHENYLEPHRINE HCL (PRESSORS) 10 MG/ML IV SOLN
INTRAVENOUS | Status: AC
  Filled 2020-06-09: qty 1

## 2020-06-09 MED ORDER — PROPOFOL 500 MG/50ML IV EMUL
INTRAVENOUS | Status: DC | PRN
  Administered 2020-06-09: 150 ug/kg/min via INTRAVENOUS

## 2020-06-09 MED ORDER — PROPOFOL 10 MG/ML IV BOLUS
INTRAVENOUS | Status: DC | PRN
  Administered 2020-06-09: 60 mg via INTRAVENOUS
  Administered 2020-06-09: 20 mg via INTRAVENOUS

## 2020-06-09 NOTE — Anesthesia Postprocedure Evaluation (Signed)
Anesthesia Post Note  Patient: Cody [name]  Procedure(s) Performed: COLONOSCOPY WITH PROPOFOL (N/A )  Patient location during evaluation: Endoscopy Anesthesia Type: General Level of consciousness: awake and alert and oriented Pain management: pain level controlled Vital Signs Assessment: post-procedure vital signs reviewed and stable Respiratory status: spontaneous breathing, nonlabored ventilation and respiratory function stable Cardiovascular status: blood pressure returned to baseline and stable Postop Assessment: no signs of nausea or vomiting Anesthetic complications: no   No complications documented.   Last Vitals:  Vitals:   06/09/20 0942 06/09/20 0952  BP: 108/79 (!) 121/91  Pulse: 68 88  Resp: 16 16  Temp:    SpO2: 97% 94%    Last Pain:  Vitals:   06/09/20 0952  TempSrc:   PainSc: 0-No pain                 Kenaz Olafson

## 2020-06-09 NOTE — Anesthesia Preprocedure Evaluation (Signed)
Anesthesia Evaluation  Patient identified by MRN, date of birth, ID band Patient awake    Reviewed: Allergy & Precautions, NPO status , Patient's Chart, lab work & pertinent test results  History of Anesthesia Complications Negative for: history of anesthetic complications  Airway Mallampati: I  TM Distance: >3 FB Neck ROM: Full    Dental no notable dental hx.    Pulmonary neg pulmonary ROS, neg sleep apnea, neg COPD,    breath sounds clear to auscultation- rhonchi (-) wheezing      Cardiovascular Exercise Tolerance: Good (-) hypertension(-) CAD and (-) Past MI  Rhythm:Regular Rate:Normal - Systolic murmurs and - Diastolic murmurs    Neuro/Psych negative neurological ROS  negative psych ROS   GI/Hepatic negative GI ROS, Neg liver ROS,   Endo/Other  negative endocrine ROSneg diabetes  Renal/GU negative Renal ROS     Musculoskeletal negative musculoskeletal ROS (+)   Abdominal (+) + obese,   Peds  Hematology negative hematology ROS (+)   Anesthesia Other Findings    Reproductive/Obstetrics                             Anesthesia Physical Anesthesia Plan  ASA: II  Anesthesia Plan: General   Post-op Pain Management:    Induction: Intravenous  PONV Risk Score and Plan: 1 and Propofol infusion  Airway Management Planned: Natural Airway  Additional Equipment:   Intra-op Plan:   Post-operative Plan:   Informed Consent: I have reviewed the patients History and Physical, chart, labs and discussed the procedure including the risks, benefits and alternatives for the proposed anesthesia with the patient or authorized representative who has indicated his/her understanding and acceptance.     Dental advisory given  Plan Discussed with: CRNA and Anesthesiologist  Anesthesia Plan Comments:         Anesthesia Quick Evaluation

## 2020-06-09 NOTE — H&P (Signed)
Cody [name] [id number] 1970-06-07     HPI:  49/o male for screening colonoscopy.  Tolerated prep well.   Medications Prior to Admission  Medication Sig Dispense Refill Last Dose  . cyclobenzaprine (FLEXERIL) 10 MG tablet Take 1 tablet (10 mg total) by mouth 3 (three) times daily as needed for muscle spasms. 30 tablet 5 06/08/2020 at Unknown time  . cyclobenzaprine (FLEXERIL) 10 MG tablet TAKE 1 TABLET (10 MG TOTAL) BY MOUTH 3 (THREE) TIMES DAILY AS NEEDED FOR MUSCLE SPASMS. 30 tablet 5 06/08/2020 at Unknown time  . meloxicam (MOBIC) 15 MG tablet Take 1 tablet (15 mg total) by mouth daily. 30 tablet 5 06/08/2020 at Unknown time  . meloxicam (MOBIC) 15 MG tablet TAKE 1 TABLET (15 MG TOTAL) BY MOUTH DAILY. 30 tablet 5 06/08/2020 at Unknown time  . polyethylene glycol powder (GLYCOLAX/MIRALAX) 17 GM/SCOOP powder TAKE 17 G BY MOUTH ONCE DAILY FOR 3 DAYS. MIX IN 4-8 OUNCES OF FLUID PRIOR TO TAKING. 255 g 0 06/08/2020 at Unknown time  . valsartan (DIOVAN) 80 MG tablet Take 1 tablet (80 mg total) by mouth daily. 60 tablet 1 06/08/2020 at Unknown time  . valsartan (DIOVAN) 80 MG tablet TAKE 1 TABLET (80 MG TOTAL) BY MOUTH DAILY. 60 tablet 1 06/08/2020 at Unknown time  . bisacodyl (DULCOLAX) 5 MG EC tablet TAKE 2 TABLETS BY MOUTH IN THE MORNING AND 2 TABLETS IN THE AFTERNOON DAY PRIOR TO MIRALAX PREP. 4 tablet 0   . sildenafil (VIAGRA) 100 MG tablet Take 1 tablet (100 mg total) by mouth daily as needed for erectile dysfunction. 10 tablet 5   . sildenafil (VIAGRA) 100 MG tablet TAKE 1 TABLET (100 MG TOTAL) BY MOUTH DAILY AS NEEDED FOR ERECTILE DYSFUNCTION. 10 tablet 5    No Known Allergies History reviewed. No pertinent past medical history. Past Surgical History:  Procedure Laterality Date  . APPENDECTOMY  2005   incidental at time of colon surgery.  Marland Kitchen BACK SURGERY  2000  . COLON SURGERY  2005   Left colectomy for redundant colon with severe constipation.  . COLONOSCOPY    . HERNIA REPAIR  12/16/2003    ventral hernia repair with Compsix Kugel mesh   Social History   Socioeconomic History  . Marital status: Divorced    Spouse name: Not on file  . Number of children: Not on file  . Years of education: Not on file  . Highest education level: Not on file  Occupational History  . Not on file  Tobacco Use  . Smoking status: Never Smoker  . Smokeless tobacco: Never Used  Vaping Use  . Vaping Use: Never used  Substance and Sexual Activity  . Alcohol use: No    Alcohol/week: 0.0 standard drinks  . Drug use: No  . Sexual activity: Yes  Other Topics Concern  . Not on file  Social History Narrative  . Not on file   Social Determinants of Health   Financial Resource Strain: Not on file  Food Insecurity: Not on file  Transportation Needs: Not on file  Physical Activity: Not on file  Stress: Not on file  Social Connections: Not on file  Intimate Partner Violence: Not on file   Social History   Social History Narrative  . Not on file     ROS: Negative.     PE: HEENT: Negative. Lungs: Clear. Cardio: RR.  Assessment/Plan:  Proceed with planned endoscopy.  [facility] 06/09/2020

## 2020-06-09 NOTE — Op Note (Signed)
[facility] Gastroenterology Patient Name: Cody [name] Procedure Date: 06/09/2020 8:47 AM MRN: [id number] Account #: [id number] Date of Birth: 1970-07-03 Admit Type: Outpatient Age: 50 Room: [facility] ENDO ROOM 1 Gender: Male Note Status: Finalized Procedure:             Colonoscopy Indications:           Screening for colorectal malignant neoplasm Providers:             Earline Mayotte, MD Medicines:             Propofol per Anesthesia Complications:         No immediate complications. Procedure:             Pre-Anesthesia Assessment:                        - Prior to the procedure, a History and Physical was                         performed, and patient medications, allergies and                         sensitivities were reviewed. The patient's tolerance                         of previous anesthesia was reviewed.                        - The risks and benefits of the procedure and the                         sedation options and risks were discussed with the                         patient. All questions were answered and informed                         consent was obtained.                        After obtaining informed consent, the colonoscope was                         passed under direct vision. Throughout the procedure,                         the patient's blood pressure, pulse, and oxygen                         saturations were monitored continuously. The                         Colonoscope was introduced through the anus and                         advanced to the the cecum, identified by appendiceal                         orifice and ileocecal valve. The colonoscopy was  somewhat difficult due to a tortuous colon. Successful                         completion of the procedure was aided by changing the                         patient to a supine position. The patient tolerated                         the procedure well. The  quality of the bowel                         preparation was excellent. Findings:      The entire examined colon appeared normal on direct and retroflexion       views. Impression:            - The entire examined colon is normal on direct and                         retroflexion views.                        - No specimens collected. Recommendation:        - Repeat colonoscopy in 10 years for screening                         purposes. Procedure Code(s):     --- Professional ---                        [phone], Colonoscopy, flexible; diagnostic, including                         collection of specimen(s) by brushing or washing, when                         performed (separate procedure) Diagnosis Code(s):     --- Professional ---                        Z12.11, Encounter for screening for malignant neoplasm                         of colon CPT copyright 2019 [employer]. All rights reserved. The codes documented in this report are preliminary and upon coder review may  be revised to meet current compliance requirements. Earline Mayotte, MD 06/09/2020 9:22:56 AM This report has been signed electronically. Number of Addenda: 0 Note Initiated On: 06/09/2020 8:47 AM Scope Withdrawal Time: 0 hours 9 minutes 16 seconds  Total Procedure Duration: 0 hours 23 minutes 27 seconds  Estimated Blood Loss:  Estimated blood loss: none.      [facility]

## 2020-06-09 NOTE — Transfer of Care (Signed)
Immediate Anesthesia Transfer of Care Note  Patient: Cody [name]  Procedure(s) Performed: COLONOSCOPY WITH PROPOFOL (N/A )  Patient Location: Endoscopy Unit  Anesthesia Type:General  Level of Consciousness: drowsy  Airway & Oxygen Therapy: Patient Spontanous Breathing  Post-op Assessment: Report given to RN and Post -op Vital signs reviewed and stable  Post vital signs: Reviewed and stable  Last Vitals:  Vitals Value Taken Time  BP 113/70 06/09/20 0923  Temp    Pulse 80 06/09/20 0923  Resp 16 06/09/20 0923  SpO2 94 % 06/09/20 0923  Vitals shown include unvalidated device data.  Last Pain:  Vitals:   06/09/20 0802  TempSrc: Temporal  PainSc: 0-No pain         Complications: No complications documented.

## 2020-06-10 ENCOUNTER — Other Ambulatory Visit: Payer: Self-pay

## 2020-06-10 ENCOUNTER — Ambulatory Visit (INDEPENDENT_AMBULATORY_CARE_PROVIDER_SITE_OTHER): Payer: No Typology Code available for payment source | Admitting: Family Medicine

## 2020-06-10 ENCOUNTER — Encounter: Payer: Self-pay | Admitting: General Surgery

## 2020-06-10 VITALS — BP 122/90 | HR 100 | Ht 74.0 in | Wt 239.0 lb

## 2020-06-10 DIAGNOSIS — M5136 Other intervertebral disc degeneration, lumbar region: Secondary | ICD-10-CM | POA: Diagnosis not present

## 2020-06-10 DIAGNOSIS — I10 Essential (primary) hypertension: Secondary | ICD-10-CM | POA: Diagnosis not present

## 2020-06-10 DIAGNOSIS — M7071 Other bursitis of hip, right hip: Secondary | ICD-10-CM

## 2020-06-10 MED ORDER — MELOXICAM 15 MG PO TABS
15.0000 mg | ORAL_TABLET | Freq: Every day | ORAL | 1 refills | Status: DC
Start: 2020-06-10 — End: 2020-06-22
  Filled 2020-06-10: qty 90, 90d supply, fill #0

## 2020-06-10 MED ORDER — HYDROCHLOROTHIAZIDE 12.5 MG PO TABS
12.5000 mg | ORAL_TABLET | Freq: Every day | ORAL | 1 refills | Status: DC
  Filled 2020-06-10: qty 90, 90d supply, fill #0

## 2020-06-10 MED ORDER — VALSARTAN 80 MG PO TABS
80.0000 mg | ORAL_TABLET | Freq: Every day | ORAL | 1 refills | Status: DC
  Filled 2020-06-10: qty 90, 90d supply, fill #0

## 2020-06-10 NOTE — Progress Notes (Signed)
Date:  06/10/2020   Name:  Cody [name]   DOB:  Oct 08, 1970   MRN:  [id number]   Chief Complaint: Hypertension and Shoulder Pain (Limited ROM bilateral shoulders and hip pain)  Hypertension This is a chronic problem. The current episode started more than 1 year ago. The problem has been waxing and waning since onset. The problem is uncontrolled. Pertinent negatives include no anxiety, blurred vision, chest pain, headaches, malaise/fatigue, neck pain, orthopnea, palpitations, peripheral edema, PND, shortness of breath or sweats. There are no associated agents to hypertension. Past treatments include angiotensin blockers. Compliance problems include diet.  There is no history of angina, kidney disease, CAD/MI, CVA, heart failure, left ventricular hypertrophy, PVD or retinopathy. There is no history of chronic renal disease, a hypertension causing med or renovascular disease.  Shoulder Pain  The pain is present in the right shoulder, left shoulder and right hip. This is a chronic problem. The current episode started more than 1 year ago. The problem occurs daily. The problem has been waxing and waning. Associated symptoms include a limited range of motion. Pertinent negatives include no fever, inability to bear weight, itching, joint locking, joint swelling, numbness, stiffness or tingling. The symptoms are aggravated by activity. The treatment provided moderate relief.  Leg Pain  Incident onset: chronic. There was no injury mechanism. The pain is present in the right hip. The pain is moderate. Pertinent negatives include no inability to bear weight, loss of motion, loss of sensation, muscle weakness, numbness or tingling. The treatment provided moderate relief.    Lab Results  Component Value Date   CREATININE 1.16 04/27/2020   BUN 15 04/27/2020   NA 141 04/27/2020   K 4.7 04/27/2020   CL 104 04/27/2020   CO2 21 04/27/2020   Lab Results  Component Value Date   CHOL 263 (H) 04/27/2020    HDL 47 04/27/2020   LDLCALC 201 (H) 04/27/2020   TRIG 86 04/27/2020   CHOLHDL 3.7 05/18/2017   Lab Results  Component Value Date   TSH 2.720 10/31/2017   No results found for: HGBA1C Lab Results  Component Value Date   WBC 5.2 10/31/2017   HGB 15.7 10/31/2017   HCT 45.9 10/31/2017   MCV 87 10/31/2017   PLT 300 10/31/2017   Lab Results  Component Value Date   ALT 24 09/15/2015   AST 26 09/15/2015   ALKPHOS 46 09/15/2015   BILITOT 0.8 09/15/2015     Review of Systems  Constitutional: Negative for chills, fever and malaise/fatigue.  HENT: Negative for drooling, ear discharge, ear pain and sore throat.   Eyes: Negative for blurred vision.  Respiratory: Negative for cough, shortness of breath and wheezing.   Cardiovascular: Negative for chest pain, palpitations, orthopnea, leg swelling and PND.  Gastrointestinal: Negative for abdominal pain, blood in stool, constipation, diarrhea and nausea.  Endocrine: Negative for polydipsia.  Genitourinary: Negative for dysuria, frequency, hematuria and urgency.  Musculoskeletal: Negative for back pain, myalgias, neck pain and stiffness.  Skin: Negative for itching and rash.  Allergic/Immunologic: Negative for environmental allergies.  Neurological: Negative for dizziness, tingling, numbness and headaches.  Hematological: Does not bruise/bleed easily.  Psychiatric/Behavioral: Negative for suicidal ideas. The patient is not nervous/anxious.     Patient Active Problem List   Diagnosis Date Noted  . Onychomycosis 09/12/2015  . CN (constipation) 01/02/2014    No Known Allergies  Past Surgical History:  Procedure Laterality Date  . APPENDECTOMY  2005   incidental at time  of colon surgery.  Marland Kitchen BACK SURGERY  2000  . COLON SURGERY  2005   Left colectomy for redundant colon with severe constipation.  . COLONOSCOPY    . COLONOSCOPY WITH PROPOFOL N/A 06/09/2020   Procedure: COLONOSCOPY WITH PROPOFOL;  Surgeon: Earline Mayotte, MD;   Location: [facility] ENDOSCOPY;  Service: Endoscopy;  Laterality: N/A;  . HERNIA REPAIR  12/16/2003   ventral hernia repair with Compsix Kugel mesh    Social History   Tobacco Use  . Smoking status: Never Smoker  . Smokeless tobacco: Never Used  Vaping Use  . Vaping Use: Never used  Substance Use Topics  . Alcohol use: No    Alcohol/week: 0.0 standard drinks  . Drug use: No     Medication list has been reviewed and updated.  Current Meds  Medication Sig  . bisacodyl (DULCOLAX) 5 MG EC tablet TAKE 2 TABLETS BY MOUTH IN THE MORNING AND 2 TABLETS IN THE AFTERNOON DAY PRIOR TO MIRALAX PREP.  Marland Kitchen cyclobenzaprine (FLEXERIL) 10 MG tablet Take 1 tablet (10 mg total) by mouth 3 (three) times daily as needed for muscle spasms.  . meloxicam (MOBIC) 15 MG tablet Take 1 tablet (15 mg total) by mouth daily.  . polyethylene glycol powder (GLYCOLAX/MIRALAX) 17 GM/SCOOP powder TAKE 17 G BY MOUTH ONCE DAILY FOR 3 DAYS. MIX IN 4-8 OUNCES OF FLUID PRIOR TO TAKING.  . sildenafil (VIAGRA) 100 MG tablet Take 1 tablet (100 mg total) by mouth daily as needed for erectile dysfunction.  . valsartan (DIOVAN) 80 MG tablet Take 1 tablet (80 mg total) by mouth daily.    PHQ 2/9 Scores 04/27/2020 12/05/2016  PHQ - 2 Score 0 0  PHQ- 9 Score 0 0    GAD 7 : Generalized Anxiety Score 04/27/2020  Nervous, Anxious, on Edge 0  Control/stop worrying 0  Worry too much - different things 0  Trouble relaxing 0  Restless 0  Easily annoyed or irritable 0  Afraid - awful might happen 0  Total GAD 7 Score 0    BP Readings from Last 3 Encounters:  06/10/20 122/90  06/09/20 (!) 121/91  04/27/20 (!) 140/94    Physical Exam Vitals and nursing note reviewed.  HENT:     Head: Normocephalic.     Right Ear: Tympanic membrane, ear canal and external ear normal. There is no impacted cerumen.     Left Ear: Tympanic membrane, ear canal and external ear normal. There is no impacted cerumen.     Nose: Nose normal. No congestion or  rhinorrhea.  Eyes:     General: No scleral icterus.       Right eye: No discharge.        Left eye: No discharge.     Conjunctiva/sclera: Conjunctivae normal.     Pupils: Pupils are equal, round, and reactive to light.  Neck:     Thyroid: No thyromegaly.     Vascular: No JVD.     Trachea: No tracheal deviation.  Cardiovascular:     Rate and Rhythm: Normal rate and regular rhythm.     Heart sounds: Normal heart sounds. No murmur heard. No friction rub. No gallop.   Pulmonary:     Effort: No respiratory distress.     Breath sounds: Normal breath sounds. No wheezing or rales.  Abdominal:     General: Bowel sounds are normal.     Palpations: Abdomen is soft. There is no mass.     Tenderness: There is no abdominal  tenderness. There is no guarding or rebound.  Musculoskeletal:        General: No tenderness.     Cervical back: Normal range of motion and neck supple.  Lymphadenopathy:     Cervical: No cervical adenopathy.  Skin:    General: Skin is warm.     Findings: No rash.  Neurological:     Mental Status: He is alert and oriented to person, place, and time.     Cranial Nerves: No cranial nerve deficit.     Deep Tendon Reflexes: Reflexes are normal and symmetric.     Wt Readings from Last 3 Encounters:  06/10/20 239 lb (108.4 kg)  06/09/20 234 lb (106.1 kg)  04/27/20 239 lb (108.4 kg)    BP 122/90   Pulse 100   Ht 6\' 2"  (1.88 m)   Wt 239 lb (108.4 kg)   BMI 30.69 kg/m   Assessment and Plan: 1. Primary hypertension Chronic.  Controlled.  Stable.  Continue valsartan at 80 mg once a day.  We will add hydrochlorothiazide 12.5 mg once a day. - valsartan (DIOVAN) 80 MG tablet; Take 1 tablet (80 mg total) by mouth daily.  Dispense: 90 tablet; Refill: 1 - hydrochlorothiazide (HYDRODIURIL) 12.5 MG tablet; Take 1 tablet (12.5 mg total) by mouth daily.  Dispense: 90 tablet; Refill: 1  2. Ischial bursitis of right side Chronic.  Uncontrolled.  Patient is unable to weight-bear  to get on a motorcycle and has to use his leg to go over the bicycle.  We will continue meloxicam 15 mg once a day.  And will refer to sports medicine for evaluation and treatment. - meloxicam (MOBIC) 15 MG tablet; Take 1 tablet (15 mg total) by mouth daily.  Dispense: 90 tablet; Refill: 1  3. Degenerative disc disease, lumbar Chronic.  Controlled.  Relatively stable.  We will continue with  meloxicam 15 mg once a day. - meloxicam (MOBIC) 15 MG tablet; Take 1 tablet (15 mg total) by mouth daily.  Dispense: 90 tablet; Refill: 1

## 2020-06-14 ENCOUNTER — Other Ambulatory Visit: Payer: Self-pay

## 2020-06-22 ENCOUNTER — Ambulatory Visit
Admission: RE | Admit: 2020-06-22 | Discharge: 2020-06-22 | Disposition: A | Discharge status: Home / Self Care | Payer: No Typology Code available for payment source | Source: Ambulatory Visit | Attending: Family Medicine | Admitting: Family Medicine

## 2020-06-22 ENCOUNTER — Encounter: Payer: Self-pay | Admitting: Family Medicine

## 2020-06-22 ENCOUNTER — Other Ambulatory Visit: Payer: Self-pay

## 2020-06-22 ENCOUNTER — Ambulatory Visit
Admission: RE | Admit: 2020-06-22 | Discharge: 2020-06-22 | Disposition: A | Discharge status: Home / Self Care | Payer: No Typology Code available for payment source | Attending: Family Medicine | Admitting: Family Medicine

## 2020-06-22 ENCOUNTER — Ambulatory Visit (INDEPENDENT_AMBULATORY_CARE_PROVIDER_SITE_OTHER): Payer: No Typology Code available for payment source | Admitting: Family Medicine

## 2020-06-22 VITALS — BP 142/82 | HR 93 | Temp 98.2°F | Ht 74.0 in | Wt 235.8 lb

## 2020-06-22 DIAGNOSIS — M25551 Pain in right hip: Secondary | ICD-10-CM

## 2020-06-22 DIAGNOSIS — M7601 Gluteal tendinitis, right hip: Secondary | ICD-10-CM | POA: Diagnosis present

## 2020-06-22 MED ORDER — DICLOFENAC SODIUM 75 MG PO TBEC
75.0000 mg | DELAYED_RELEASE_TABLET | Freq: Two times a day (BID) | ORAL | 0 refills | Status: DC
  Filled 2020-06-22: qty 30, 15d supply, fill #0

## 2020-06-22 MED ORDER — PREDNISONE 50 MG PO TABS
50.0000 mg | ORAL_TABLET | Freq: Every day | ORAL | 0 refills | Status: DC
  Filled 2020-06-22: qty 5, 5d supply, fill #0

## 2020-06-22 MED FILL — Sildenafil Citrate Tab 100 MG: ORAL | 30 days supply | Qty: 6 | Fill #0 | Status: AC

## 2020-06-22 NOTE — Progress Notes (Signed)
Primary Care / Sports Medicine Office Visit  Patient Information:  Patient ID: Cody [name], male DOB: 1970-05-19 Age: 50 y.o. MRN: [id number]   Cody [name] is a pleasant 50 y.o. male presenting with the following:  Chief Complaint  Patient presents with  . New Patient (Initial Visit)       . Hip Pain    Right; x1 year; became more severe in the last 3-4 months; no known injury; constant soreness and intermittent sharp pains; relieved by sitting; taking meloxicam 15 mg daily and Tylenol PM with no relief; 5/10 pain    Review of Systems pertinent details above   Patient Active Problem List   Diagnosis Date Noted  . Gluteal tendinitis, right hip 06/22/2020  . Hip pain, right 06/22/2020  . Onychomycosis 09/12/2015  . CN (constipation) 01/02/2014   History reviewed. No pertinent past medical history. Outpatient Medications Prior to Visit  Medication Sig Dispense Refill  . bisacodyl (DULCOLAX) 5 MG EC tablet TAKE 2 TABLETS BY MOUTH IN THE MORNING AND 2 TABLETS IN THE AFTERNOON DAY PRIOR TO MIRALAX PREP. 4 tablet 0  . cyclobenzaprine (FLEXERIL) 10 MG tablet Take 1 tablet (10 mg total) by mouth 3 (three) times daily as needed for muscle spasms. 30 tablet 5  . hydrochlorothiazide (HYDRODIURIL) 12.5 MG tablet Take 1 tablet (12.5 mg total) by mouth daily. 90 tablet 1  . polyethylene glycol powder (GLYCOLAX/MIRALAX) 17 GM/SCOOP powder TAKE 17 G BY MOUTH ONCE DAILY FOR 3 DAYS. MIX IN 4-8 OUNCES OF FLUID PRIOR TO TAKING. 255 g 0  . sildenafil (VIAGRA) 100 MG tablet Take 1 tablet (100 mg total) by mouth daily as needed for erectile dysfunction. 10 tablet 5  . valsartan (DIOVAN) 80 MG tablet Take 1 tablet (80 mg total) by mouth daily. 90 tablet 1  . meloxicam (MOBIC) 15 MG tablet Take 1 tablet (15 mg total) by mouth daily. 90 tablet 1   No facility-administered medications prior to visit.    Past Surgical History:  Procedure Laterality Date  . APPENDECTOMY  2005   incidental at time  of colon surgery.  Marland Kitchen BACK SURGERY  2000  . COLON SURGERY  2005   Left colectomy for redundant colon with severe constipation.  . COLONOSCOPY    . COLONOSCOPY WITH PROPOFOL N/A 06/09/2020   Procedure: COLONOSCOPY WITH PROPOFOL;  Surgeon: Earline Mayotte, MD;  Location: [facility] ENDOSCOPY;  Service: Endoscopy;  Laterality: N/A;  . HERNIA REPAIR  12/16/2003   ventral hernia repair with Compsix Kugel mesh   Social History   Socioeconomic History  . Marital status: Divorced    Spouse name: Not on file  . Number of children: Not on file  . Years of education: Not on file  . Highest education level: Not on file  Occupational History  . Not on file  Tobacco Use  . Smoking status: Never Smoker  . Smokeless tobacco: Never Used  Vaping Use  . Vaping Use: Never used  Substance and Sexual Activity  . Alcohol use: No    Alcohol/week: 0.0 standard drinks  . Drug use: No  . Sexual activity: Yes  Other Topics Concern  . Not on file  Social History Narrative  . Not on file   Social Determinants of Health   Financial Resource Strain: Not on file  Food Insecurity: Not on file  Transportation Needs: Not on file  Physical Activity: Not on file  Stress: Not on file  Social Connections: Not on  file  Intimate Partner Violence: Not on file   History reviewed. No pertinent family history. No Known Allergies  Vitals:   06/22/20 0931  BP: (!) 142/82  Pulse: 93  Temp: 98.2 F (36.8 C)  SpO2: 97%   Vitals:   06/22/20 0931  Weight: 235 lb 12.8 oz (107 kg)  Height: 6\' 2"  (1.88 m)   Body mass index is 30.27 kg/m.  No results found.   Independent interpretation of notes and tests performed by another provider:   None  Procedures performed:   None  Pertinent History, Exam, Impression, and Recommendations:   Gluteal tendinitis, right hip Longstanding anterolateral atraumatic right hip pain in the setting of lumbar disease status postsurgery.  Pain is aggravated with  weightbearing, local without radiation.  He does have paresthesias that have been going on prior to this which have been unchanged.  During physical exam pain is provoked and recreated during FABER testing, secondarily so during FADIR testing.  He has tenderness along the mid-distal gluteus medius, nontender at the greater trochanter, minimally tender at the groin.  Upon weightbearing and right single-leg stance he is able to recreate this pain at the lateral hip superior to the greater trochanter, this is further aggravated during twisting motions without overt groin pain.  Straight leg raise testing is benign, strength is preserved.  His symptoms are most consistent with gluteal tendinopathy with additional concern for aggravation of the femoroacetabular joint.  Confounding element includes known lumbar disease status post surgery.  I have advised patient of the same and we will proceed with x-ray of right hip to evaluate the joint, he will initiate a course of scheduled diclofenac 75 mg in place of meloxicam.  I have advised him that if symptoms fail to improve in 1 week, he is to dose prednisone as prescribed.  Plan for return in 2 weeks for symptom and x-ray review.  If suboptimal progress, pending focality of symptoms, consideration for local injections as clinically guided.    Orders & Medications Meds ordered this encounter  Medications  . diclofenac (VOLTAREN) 75 MG EC tablet    Sig: Take 1 tablet (75 mg total) by mouth 2 (two) times daily.    Dispense:  30 tablet    Refill:  0  . predniSONE (DELTASONE) 50 MG tablet    Sig: Take 1 tablet (50 mg total) by mouth daily.    Dispense:  5 tablet    Refill:  0   Orders Placed This Encounter  Procedures  . DG HIP UNILAT W OR W/O PELVIS 2-3 VIEWS RIGHT     Return in about 2 weeks (around 07/06/2020) for follow-up right hip pain, review xrays.     09/05/2020, MD   Primary Care Sports Medicine [facility]

## 2020-06-22 NOTE — Patient Instructions (Signed)
-   Obtain x-rays with order provided today - Stop meloxicam and start diclofenac, dose twice daily with food - If limited response in 1 week, start prednisone burst prescription - Activity as tolerated using hip symptoms as a guide - Return for follow-up in 2 weeks

## 2020-06-22 NOTE — Assessment & Plan Note (Signed)
Longstanding anterolateral atraumatic right hip pain in the setting of lumbar disease status postsurgery.  Pain is aggravated with weightbearing, local without radiation.  He does have paresthesias that have been going on prior to this which have been unchanged.  During physical exam pain is provoked and recreated during FABER testing, secondarily so during FADIR testing.  He has tenderness along the mid-distal gluteus medius, nontender at the greater trochanter, minimally tender at the groin.  Upon weightbearing and right single-leg stance he is able to recreate this pain at the lateral hip superior to the greater trochanter, this is further aggravated during twisting motions without overt groin pain.  Straight leg raise testing is benign, strength is preserved.  His symptoms are most consistent with gluteal tendinopathy with additional concern for aggravation of the femoroacetabular joint.  Confounding element includes known lumbar disease status post surgery.  I have advised patient of the same and we will proceed with x-ray of right hip to evaluate the joint, he will initiate a course of scheduled diclofenac 75 mg in place of meloxicam.  I have advised him that if symptoms fail to improve in 1 week, he is to dose prednisone as prescribed.  Plan for return in 2 weeks for symptom and x-ray review.  If suboptimal progress, pending focality of symptoms, consideration for local injections as clinically guided.

## 2020-06-23 ENCOUNTER — Encounter: Payer: Self-pay | Admitting: Family Medicine

## 2020-06-24 NOTE — Telephone Encounter (Signed)
Please review.  KP

## 2020-06-24 NOTE — Telephone Encounter (Signed)
Please advise.  Patient reviewed X-Ray results note yesterday.

## 2020-06-24 NOTE — Telephone Encounter (Signed)
Patient has an appointment on 07/06/20, do you want him to be seen sooner?

## 2020-06-29 ENCOUNTER — Other Ambulatory Visit: Payer: Self-pay

## 2020-06-29 ENCOUNTER — Ambulatory Visit
Admission: RE | Admit: 2020-06-29 | Discharge: 2020-06-29 | Disposition: A | Discharge status: Home / Self Care | Payer: No Typology Code available for payment source | Source: Ambulatory Visit | Attending: General Surgery | Admitting: General Surgery

## 2020-06-29 DIAGNOSIS — R1033 Periumbilical pain: Secondary | ICD-10-CM | POA: Insufficient documentation

## 2020-06-29 MED ORDER — IOHEXOL 300 MG/ML  SOLN
100.0000 mL | Freq: Once | INTRAMUSCULAR | Status: AC | PRN
  Administered 2020-06-29: 100 mL via INTRAVENOUS

## 2020-07-06 ENCOUNTER — Ambulatory Visit (INDEPENDENT_AMBULATORY_CARE_PROVIDER_SITE_OTHER): Payer: No Typology Code available for payment source | Admitting: Family Medicine

## 2020-07-06 ENCOUNTER — Other Ambulatory Visit: Payer: Self-pay

## 2020-07-06 ENCOUNTER — Encounter: Payer: Self-pay | Admitting: Family Medicine

## 2020-07-06 VITALS — BP 138/82 | HR 85 | Temp 98.2°F | Ht 74.0 in | Wt 239.0 lb

## 2020-07-06 DIAGNOSIS — M7601 Gluteal tendinitis, right hip: Secondary | ICD-10-CM

## 2020-07-06 DIAGNOSIS — M1611 Unilateral primary osteoarthritis, right hip: Secondary | ICD-10-CM | POA: Insufficient documentation

## 2020-07-06 DIAGNOSIS — M25551 Pain in right hip: Secondary | ICD-10-CM

## 2020-07-06 MED ORDER — DICLOFENAC SODIUM 75 MG PO TBEC
DELAYED_RELEASE_TABLET | ORAL | 0 refills | Status: DC
  Filled 2020-07-06: qty 60, 30d supply, fill #0

## 2020-07-06 NOTE — Progress Notes (Signed)
Primary Care / Sports Medicine Office Visit  Patient Information:  Patient ID: Cody [name], male DOB: [birth year] Age: 50 y.o. MRN: [id number]   Cody [name] is a pleasant 50 y.o. male presenting with the following:  Chief Complaint  Patient presents with  . Follow-up  . Gluteal tendinitis, right hip    Feeling better today in office; not doing any strenuous activities to exacerbate pain;  taking diclofenac twice daily with relief; 2/10 pain    Review of Systems pertinent details above   Patient Active Problem List   Diagnosis Date Noted  . Primary osteoarthritis of right hip 07/06/2020  . Gluteal tendinitis, right hip 06/22/2020  . Onychomycosis 09/12/2015  . CN (constipation) 01/02/2014   History reviewed. No pertinent past medical history. Outpatient Medications Prior to Visit  Medication Sig Dispense Refill  . sildenafil (VIAGRA) 100 MG tablet Take 1 tablet (100 mg total) by mouth daily as needed for erectile dysfunction. 10 tablet 5  . sildenafil (VIAGRA) 100 MG tablet TAKE 1 TABLET (100 MG TOTAL) BY MOUTH DAILY AS NEEDED FOR ERECTILE DYSFUNCTION. 10 tablet 5  . valsartan (DIOVAN) 80 MG tablet Take 1 tablet (80 mg total) by mouth daily. 90 tablet 1  . diclofenac (VOLTAREN) 75 MG EC tablet Take 1 tablet (75 mg total) by mouth 2 (two) times daily. 30 tablet 0  . cyclobenzaprine (FLEXERIL) 10 MG tablet Take 1 tablet (10 mg total) by mouth 3 (three) times daily as needed for muscle spasms. (Patient not taking: Reported on 07/06/2020) 30 tablet 5  . hydrochlorothiazide (HYDRODIURIL) 12.5 MG tablet Take 1 tablet (12.5 mg total) by mouth daily. (Patient not taking: Reported on 07/06/2020) 90 tablet 1  . predniSONE (DELTASONE) 50 MG tablet Take 1 tablet (50 mg total) by mouth daily. (Patient not taking: Reported on 07/06/2020) 5 tablet 0  . bisacodyl (DULCOLAX) 5 MG EC tablet TAKE 2 TABLETS BY MOUTH IN THE MORNING AND 2 TABLETS IN THE AFTERNOON DAY PRIOR TO MIRALAX PREP. 4 tablet 0   . polyethylene glycol powder (GLYCOLAX/MIRALAX) 17 GM/SCOOP powder TAKE 17 G BY MOUTH ONCE DAILY FOR 3 DAYS. MIX IN 4-8 OUNCES OF FLUID PRIOR TO TAKING. 255 g 0   No facility-administered medications prior to visit.    Past Surgical History:  Procedure Laterality Date  . APPENDECTOMY  2005   incidental at time of colon surgery.  Marland Kitchen BACK SURGERY  2000  . COLON SURGERY  2005   Left colectomy for redundant colon with severe constipation.  . COLONOSCOPY    . COLONOSCOPY WITH PROPOFOL N/A 06/09/2020   Procedure: COLONOSCOPY WITH PROPOFOL;  Surgeon: Earline Mayotte, MD;  Location: [facility] ENDOSCOPY;  Service: Endoscopy;  Laterality: N/A;  . HERNIA REPAIR  12/16/2003   ventral hernia repair with Compsix Kugel mesh   Social History   Socioeconomic History  . Marital status: Divorced    Spouse name: Not on file  . Number of children: Not on file  . Years of education: Not on file  . Highest education level: Not on file  Occupational History  . Not on file  Tobacco Use  . Smoking status: Never Smoker  . Smokeless tobacco: Never Used  Vaping Use  . Vaping Use: Never used  Substance and Sexual Activity  . Alcohol use: Not Currently    Alcohol/week: 0.0 standard drinks  . Drug use: Never  . Sexual activity: Yes    Partners: Female  Other Topics Concern  . Not  on file  Social History Narrative  . Not on file   Social Determinants of Health   Financial Resource Strain: Not on file  Food Insecurity: Not on file  Transportation Needs: Not on file  Physical Activity: Not on file  Stress: Not on file  Social Connections: Not on file  Intimate Partner Violence: Not on file   History reviewed. No pertinent family history. No Known Allergies  Vitals:   07/06/20 0930  BP: 138/82  Pulse: 85  Temp: 98.2 F (36.8 C)  SpO2: 96%   Vitals:   07/06/20 0930  Weight: 239 lb (108.4 kg)  Height: 6\' 2"  (1.88 m)   Body mass index is 30.69 kg/m.    DG HIP UNILAT W OR W/O PELVIS  2-3 VIEWS RIGHT  Result Date: 06/23/2020 CLINICAL DATA:  Right anterolateral hip and groin pain which is worse with weight-bearing. No known injury. EXAM: DG HIP (WITH OR WITHOUT PELVIS) 2-3V RIGHT COMPARISON:  None. FINDINGS: There is no acute bony or joint abnormality. The patient has right hip osteoarthritis with near bone-on-bone joint space narrowing superiorly and some osteophytosis. Small subchondral cyst in the acetabulum is noted. Left hip joint space is preserved. No avascular necrosis of the femoral heads. No lytic or sclerotic lesion. Soft tissues are negative. IMPRESSION: Moderate to moderately severe right hip osteoarthritis. The exam is otherwise negative. Electronically Signed   By: 06/25/2020 M.D.   On: 06/23/2020 10:20     Independent interpretation of notes and tests performed by another provider:   Independent interpretation of right hip x-ray dated 06/23/2020 reveals moderate right hip osteoarthritis with cam deformation and acetabular spurring, narrowing of joint space, no acute osseous process identified  Procedures performed:   None  Pertinent History, Exam, Impression, and Recommendations:   Primary osteoarthritis of right hip Patient with recent hip radiographs revealing moderate right-sided osteoarthritis, mild on the left, his physical exam findings are consistent with the same though they do show interval improvement during provocative testing.  Subjectively he also relays subtle improvement.  At this stage given his stated symptoms we have reviewed treatment strategies inclusive of continued medication, intra-articular injection, and physical therapy.  He is amenable to continued medications and starting physical therapy.  He will attend 1-2 times per week for 4-6 weeks.  Plan for 4-week return for reevaluation of symptoms.  I have advised him to contact our office for any pain that prevents him from participating in physical therapy, if noted he may benefit from  sooner visit and intra-articular right hip injection.     Orders & Medications Meds ordered this encounter  Medications  . diclofenac (VOLTAREN) 75 MG EC tablet    Sig: Take 1-2 tablets daily, separate doses by 12 hours    Dispense:  60 tablet    Refill:  0   Orders Placed This Encounter  Procedures  . Ambulatory referral to Physical Therapy     Return in about 4 weeks (around 08/03/2020).     10/03/2020, MD   Primary Care Sports Medicine [facility]

## 2020-07-06 NOTE — Patient Instructions (Signed)
-   Start PT with referral (they will contact you but if you have not heard from them in a few days, reach out) - Continue diclofenac daily with second dose being as-needed - Return in 4 weeks

## 2020-07-06 NOTE — Assessment & Plan Note (Deleted)
Patient with recent hip radiographs revealing moderate right-sided osteoarthritis, mild on the left, his physical exam findings are consistent with the same though they do show interval improvement during provocative testing.  Subjectively he also relays subtle improvement.  At this stage given his stated symptoms we have reviewed treatment strategies inclusive of continued medication, intra-articular injection, and physical therapy.  He is amenable to continued medications and starting physical therapy.  He will attend 1-2 times per week for 4-6 weeks.  Plan for 4-week return for reevaluation of symptoms.  I have advised him to contact our office for any pain that prevents him from participating in physical therapy, if noted he may benefit from sooner visit and intra-articular right hip injection.

## 2020-07-06 NOTE — Assessment & Plan Note (Signed)
Patient with recent hip radiographs revealing moderate right-sided osteoarthritis, mild on the left, his physical exam findings are consistent with the same though they do show interval improvement during provocative testing.  Subjectively he also relays subtle improvement.  At this stage given his stated symptoms we have reviewed treatment strategies inclusive of continued medication, intra-articular injection, and physical therapy.  He is amenable to continued medications and starting physical therapy.  He will attend 1-2 times per week for 4-6 weeks.  Plan for 4-week return for reevaluation of symptoms.  I have advised him to contact our office for any pain that prevents him from participating in physical therapy, if noted he may benefit from sooner visit and intra-articular right hip injection. 

## 2020-07-08 ENCOUNTER — Other Ambulatory Visit: Payer: Self-pay

## 2020-07-14 ENCOUNTER — Encounter: Payer: Self-pay | Admitting: Family Medicine

## 2020-07-28 ENCOUNTER — Other Ambulatory Visit: Payer: Self-pay

## 2020-07-28 MED FILL — Sildenafil Citrate Tab 100 MG: ORAL | 30 days supply | Qty: 6 | Fill #1 | Status: AC

## 2020-08-05 ENCOUNTER — Ambulatory Visit: Payer: No Typology Code available for payment source | Admitting: Family Medicine

## 2020-08-09 ENCOUNTER — Ambulatory Visit: Payer: No Typology Code available for payment source | Attending: Family Medicine | Admitting: Physical Therapy

## 2020-08-09 ENCOUNTER — Encounter: Payer: Self-pay | Admitting: Physical Therapy

## 2020-08-09 ENCOUNTER — Other Ambulatory Visit: Payer: Self-pay

## 2020-08-09 DIAGNOSIS — M25551 Pain in right hip: Secondary | ICD-10-CM | POA: Diagnosis present

## 2020-08-09 DIAGNOSIS — R2689 Other abnormalities of gait and mobility: Secondary | ICD-10-CM | POA: Diagnosis present

## 2020-08-09 DIAGNOSIS — M25651 Stiffness of right hip, not elsewhere classified: Secondary | ICD-10-CM

## 2020-08-09 DIAGNOSIS — M545 Low back pain, unspecified: Secondary | ICD-10-CM | POA: Diagnosis present

## 2020-08-09 DIAGNOSIS — G8929 Other chronic pain: Secondary | ICD-10-CM | POA: Insufficient documentation

## 2020-08-09 NOTE — Therapy (Addendum)
[facility] [address] [place], [place], [address] Phone: [phone]   Fax:  [phone]  Physical Therapy Evaluation  Patient Details  Name: Cody [name] MRN: [id number] Date of Birth: Jan 25, 1971 Referring Provider (PT): Dr. Joseph Berkshire  Encounter Date: 08/09/2020   PT End of Session - 08/09/20 1240     Visit Number 1    Number of Visits 6    Date for PT Re-Evaluation 11/01/20    Authorization - Visit Number 1    Authorization - Number of Visits 10    PT Start Time 1115    PT Stop Time 1220    PT Time Calculation (min) 65 min    Activity Tolerance Patient tolerated treatment well;No increased pain    Behavior During Therapy [facility] for tasks assessed/performed             History reviewed. No pertinent past medical history.  Past Surgical History:  Procedure Laterality Date   APPENDECTOMY  2005   incidental at time of colon surgery.   BACK SURGERY  2000   COLON SURGERY  2005   Left colectomy for redundant colon with severe constipation.   COLONOSCOPY     COLONOSCOPY WITH PROPOFOL N/A 06/09/2020   Procedure: COLONOSCOPY WITH PROPOFOL;  Surgeon: Earline Mayotte, MD;  Location: [facility] ENDOSCOPY;  Service: Endoscopy;  Laterality: N/A;   HERNIA REPAIR  12/16/2003   ventral hernia repair with Compsix Kugel mesh    There were no vitals filed for this visit.    Subjective Assessment - 08/09/20 1302     Subjective Chief complaint:  Pt running quite a bit stopped d/t pain a year ago. Swinging R LE on the motorcycle pain to 7/10. 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 Pain in R hip with weight bearing but not limiting his job duty.    Limitations Sitting;Standing;Walking    How long can you sit comfortably? need to reposition frequent    How long can you stand comfortably? No limits but increase pain  (frequent posture changes)    How long can you walk  comfortably? No limits but increase pain  (frequent posture changes)    Diagnostic tests X-ray (see imaging)    Patient Stated Goals Return to running/ decrease R hip pain with everyday tasks.    Currently in Pain? Yes    Pain Score 1     Pain Location Hip    Pain Orientation Right;Lateral    Pain Descriptors / Indicators Aching;Sharp    Pain Type Chronic pain    Pain Onset More than a month ago    Pain Frequency Intermittent    Aggravating Factors  weight bearing, getting on the motorcycle    Pain Relieving Factors resting    Effect of Pain on Daily Activities running              OBJECTIVE   Mental Status Patient is oriented to person, place and time.  Recent memory is intact.  Remote memory is intact.  Attention span and concentration are intact.  Expressive speech is intact.  Patient's fund of knowledge is within normal limits for educational level.   SENSATION: Grossly intact    MUSCULOSKELETAL: Tremor: None Bulk: Normal Tone: Normal No visible step-off along spinal column   Posture Good static posture   Gait R Trendelenburg    Palpation Tender to palpate R greater trochanter.    Strength (out  of 5) R/L 4+/4+ Hip flexion 5/5 Hip ER 5/5 Hip IR 4-*/5 Hip abduction (SL) 5/5 Hip adduction (SL) 5/5 Hip extension  5/5 Knee extension 5/5 Knee flexion 5/5 Ankle dorsiflexion 5/5 Ankle plantarflexion (seated) *Indicates pain    ROM Back: R sidebending pain in R hip   Hip:  L: IR 30, ER 40 R IR 35, ER 12, flex 90  SPECIAL TESTS  Trendelenburg: R: Positive L: Negative Thomas: 10 deg bil hip flexion and abduction FABER: L:_-, R:+ Scour: (+) R, (-) L.   Resisted supine abduction: (-) bilaterally  Functional Tasks SLS: L 30s, R 1 LOB at 20s.    [facility] PT Assessment - 08/09/20 0001       Assessment   Medical Diagnosis R gluteal tendonitis/ R hip pain    Referring Provider (PT) Dr. Joseph Berkshire    Onset Date/Surgical Date 02/28/20    Prior  Therapy Not for R hip      Precautions   Precautions None      Restrictions   Weight Bearing Restrictions No      Balance Screen   Has the patient fallen in the past 6 months No    Has the patient had a decrease in activity level because of a fear of falling?  Yes    Is the patient reluctant to leave their home because of a fear of falling?  No      Home [employer]ist information centre manager residence      Prior Function   Level of Independence Independent      Cognition   Overall Cognitive Status Within Functional Limits for tasks assessed               Objective measurements completed on examination: See above findings.    See HEP     PT Education - 08/09/20 1204     Education Details [email or web address]    Person(s) Educated Patient    Methods Explanation;Demonstration;Tactile cues;Verbal cues;Handout    Comprehension Verbalized understanding              PT Short Term Goals - 08/09/20 1309       PT SHORT TERM GOAL #1   Title Pt. will increase R hip ER to 25 deg. to facilitate getting on motorcycle without increase symptoms.    Baseline R hip ER: 12 deg.    Time 4    Period Weeks    Status New    Target Date 09/06/20      PT SHORT TERM GOAL #2   Title Pt will be independently with HEP on a consistent basis to promote normalized gait pattern.    Baseline Currently not exercising.  R trendelenburg gait    Time 4    Period Weeks    Status New    Target Date 09/06/20               PT Long Term Goals - 08/09/20 1315       PT LONG TERM GOAL #1   Title Pt will increase FOTO score to 72 to improve function ability and pain reduction    Baseline 58    Time 12    Period Weeks    Status New    Target Date 11/01/20      PT LONG TERM GOAL #2   Title Pt will increase R hip strength abduction and B hip flexion to 5/5 MMT to promote return to running for 1 mile  Baseline BLE strength grossly 5/5 MMT except R hip flexion 4+/5 MMT and R hip  abduction 4-/5 MMT with concordant pain. (+) R Trendelengberg gait    Time 12    Period Weeks    Status New    Target Date 11/01/20      PT LONG TERM GOAL #3   Title Pt. will demonstrate proper running technique for 10 min with no increase R hip pain to facilitate personal wellness program.    Baseline Pt. currently not able to run due to R hip pain.    Time 12    Period Weeks    Status New    Target Date 11/01/20                    Plan - 08/09/20 1242     Clinical Impression Statement Patient is a 50 year old presenting to clinic with chief complaints of increased R hip pain and decreased standing/walking/running tolerance. Upon examination, patient demonstrates deficits in posture, hip ROM (R<L), BLE strength, gait, balance as evidenced by R hip ER <20 degrees, BLE strength grossly 5/5 MMT except R hip flexion 4+/5 MMT and R hip abduction 4-/5 MMT with concordant pain.  (+) R Trendelengberg gait, decreased SLS R20 seconds, L30 seconds.  Patient's response on FOTO outcome measures (58) indicate significant functional limitations/disability/distress.  Patient was able to achieve decreased hip pain in standing during today's evaluation and responded positively to isometric strengthening.  Decreased in pain with L sidelying R hip abduction.  Patient will benefit from continued skilled therapeutic intervention to address deficits in posture, hip ROM, BLE strength, and gait in order to return to full participation in ADLs, return to running and getting on motorcycle.    Personal Factors and Comorbidities Past/Current Experience    Examination-Activity Limitations Lift;Carry;Caring for Others;Locomotion Level;Sleep    Examination-Participation Restrictions Occupation    Stability/Clinical Decision Making Evolving/Moderate complexity    Clinical Decision Making Moderate    Rehab Potential Good    PT Frequency Biweekly    PT Duration 12 weeks    PT Treatment/Interventions  Biofeedback;Iontophoresis 4mg /ml Dexamethasone;Electrical Stimulation;Cryotherapy;Ultrasound;Traction;Therapeutic activities;Therapeutic exercise;Balance training;Neuromuscular re-education;Gait training;Stair training;Functional mobility training;Patient/family education;Manual techniques;Dry needling;Passive range of motion    PT Next Visit Plan Progress HEP/ Start with hip isometrics/ Hip MWM    PT Home Exercise Plan             Patient will benefit from skilled therapeutic intervention in order to improve the following deficits and impairments:  Pain, Abnormal gait, Decreased balance, Decreased endurance, Decreased mobility, Difficulty walking, Hypomobility, Decreased range of motion, Decreased activity tolerance, Decreased strength, Impaired flexibility, Postural dysfunction  Visit Diagnosis: Pain in right hip  Other abnormalities of gait and mobility  Chronic bilateral low back pain without sciatica  Stiffness of right hip, not elsewhere classified     Problem List Patient Active Problem List   Diagnosis Date Noted   Primary osteoarthritis of right hip 07/06/2020   Gluteal tendinitis, right hip 06/22/2020   Onychomycosis 09/12/2015   CN (constipation) 01/02/2014   13/07/2013, PT, DPT # [id number] 08/09/2020, 2:25 PM  [facility] [address] [place], [place], [place] Phone: [phone]   Fax:  [phone]  Name: Cody [name] MRN: Louanne Belton Date of Birth: [birth year]

## 2020-08-09 NOTE — Patient Instructions (Signed)
Access Code: [email or web address]: [email or web address]: 06/13/2022Prepared by: Casimiro Needle SherkExercises  Supine Bridge - 1 x daily - 5 x weekly - 2 sets - 10 reps  Single Knee to Chest Stretch - 1 x daily - 5 x weekly - 3 sets - 30s hold  Supine Piriformis Stretch with Towel - 1 x daily - 5 x weekly - 3 sets - 30s hold  Hooklying Isometric Clamshell - 1 x daily - 5 x weekly - 3 sets - 30s hold

## 2020-08-18 ENCOUNTER — Other Ambulatory Visit: Payer: Self-pay | Admitting: Family Medicine

## 2020-08-18 ENCOUNTER — Other Ambulatory Visit: Payer: Self-pay

## 2020-08-18 MED FILL — Sildenafil Citrate Tab 100 MG: ORAL | 30 days supply | Qty: 6 | Fill #2 | Status: CN

## 2020-08-19 ENCOUNTER — Other Ambulatory Visit: Payer: Self-pay

## 2020-08-19 MED ORDER — DICLOFENAC SODIUM 75 MG PO TBEC
DELAYED_RELEASE_TABLET | ORAL | 0 refills | Status: DC
  Filled 2020-08-19: qty 60, 30d supply, fill #0

## 2020-08-25 ENCOUNTER — Other Ambulatory Visit: Payer: Self-pay

## 2020-08-25 MED FILL — Sildenafil Citrate Tab 100 MG: ORAL | 30 days supply | Qty: 6 | Fill #2 | Status: AC

## 2020-08-26 ENCOUNTER — Ambulatory Visit: Payer: No Typology Code available for payment source | Admitting: Physical Therapy

## 2020-09-09 ENCOUNTER — Encounter: Payer: No Typology Code available for payment source | Admitting: Physical Therapy

## 2020-09-10 ENCOUNTER — Ambulatory Visit: Payer: No Typology Code available for payment source | Admitting: Family Medicine

## 2020-09-14 ENCOUNTER — Ambulatory Visit (INDEPENDENT_AMBULATORY_CARE_PROVIDER_SITE_OTHER): Payer: No Typology Code available for payment source | Admitting: Family Medicine

## 2020-09-14 ENCOUNTER — Encounter: Payer: Self-pay | Admitting: Family Medicine

## 2020-09-14 ENCOUNTER — Other Ambulatory Visit: Payer: Self-pay

## 2020-09-14 ENCOUNTER — Inpatient Hospital Stay (INDEPENDENT_AMBULATORY_CARE_PROVIDER_SITE_OTHER): Payer: No Typology Code available for payment source | Admitting: Radiology

## 2020-09-14 ENCOUNTER — Ambulatory Visit: Payer: Self-pay

## 2020-09-14 VITALS — BP 146/98 | HR 68 | Temp 98.0°F | Ht 74.0 in | Wt 238.0 lb

## 2020-09-14 DIAGNOSIS — M1611 Unilateral primary osteoarthritis, right hip: Secondary | ICD-10-CM

## 2020-09-14 MED ORDER — MELOXICAM 15 MG PO TABS
15.0000 mg | ORAL_TABLET | Freq: Every day | ORAL | 0 refills | Status: DC | PRN
  Filled 2020-09-14: qty 30, 30d supply, fill #0

## 2020-09-14 NOTE — Addendum Note (Signed)
Addended by: Annita Brod on: 09/14/2020 11:06 AM   Modules accepted: Orders

## 2020-09-14 NOTE — Progress Notes (Signed)
Primary Care / Sports Medicine Office Visit  Patient Information:  Patient ID: Cody [name], male DOB: 1970-06-04 Age: 50 y.o. MRN: [id number]   Cody [name] is a pleasant 50 y.o. male presenting with the following:  Chief Complaint  Patient presents with   Follow-up   Primary osteoarthritis of right hip    Will follow with PT, next appointment 10/07/20, has only had one appointment with them; taking diclofenac twice daily with no relief; 9/10 pain    Review of Systems pertinent details above   Patient Active Problem List   Diagnosis Date Noted   Primary osteoarthritis of right hip 07/06/2020   Gluteal tendinitis, right hip 06/22/2020   Onychomycosis 09/12/2015   CN (constipation) 01/02/2014   Past Medical History:  Diagnosis Date   Hypertension    Outpatient Encounter Medications as of 09/14/2020  Medication Sig   meloxicam (MOBIC) 15 MG tablet Take 1 tablet (15 mg total) by mouth daily as needed for pain.   [DISCONTINUED] diclofenac (VOLTAREN) 75 MG EC tablet Take 1-2 tablets by mouth daily, separate doses by 12 hours   cyclobenzaprine (FLEXERIL) 10 MG tablet Take 1 tablet (10 mg total) by mouth 3 (three) times daily as needed for muscle spasms. (Patient not taking: Reported on 09/14/2020)   hydrochlorothiazide (HYDRODIURIL) 12.5 MG tablet Take 1 tablet (12.5 mg total) by mouth daily. (Patient not taking: No sig reported)   predniSONE (DELTASONE) 50 MG tablet Take 1 tablet (50 mg total) by mouth daily. (Patient not taking: No sig reported)   sildenafil (VIAGRA) 100 MG tablet TAKE 1 TABLET (100 MG TOTAL) BY MOUTH DAILY AS NEEDED FOR ERECTILE DYSFUNCTION. (Patient not taking: Reported on 09/14/2020)   valsartan (DIOVAN) 80 MG tablet Take 1 tablet (80 mg total) by mouth daily. (Patient not taking: Reported on 09/14/2020)   [DISCONTINUED] sildenafil (VIAGRA) 100 MG tablet Take 1 tablet (100 mg total) by mouth daily as needed for erectile dysfunction.   No facility-administered  encounter medications on file as of 09/14/2020.   Past Surgical History:  Procedure Laterality Date   APPENDECTOMY  2005   incidental at time of colon surgery.   BACK SURGERY  2000   COLON SURGERY  2005   Left colectomy for redundant colon with severe constipation.   COLONOSCOPY     COLONOSCOPY WITH PROPOFOL N/A 06/09/2020   Procedure: COLONOSCOPY WITH PROPOFOL;  Surgeon: Earline Mayotte, MD;  Location: [facility] ENDOSCOPY;  Service: Endoscopy;  Laterality: N/A;   HERNIA REPAIR  12/16/2003   ventral hernia repair with Compsix Kugel mesh    Vitals:   09/14/20 0918  BP: (!) 146/98  Pulse: 68  Temp: 98 F (36.7 C)  SpO2: 97%   Vitals:   09/14/20 0918  Weight: 238 lb (108 kg)  Height: 6\' 2"  (1.88 m)   Body mass index is 30.56 kg/m.     Independent interpretation of notes and tests performed by another provider:   None  Procedures performed:   Procedure:  Injection of right intra-articular hip under ultrasound guidance. Ultrasound guidance utilized for needle placement, femoral head/neck junction visualized Samsung HS60 device utilized with permanent recording / reporting. Consent obtained and verified. Skin prepped in a sterile fashion. Ethyl chloride spray for topical local analgesia.  Completed without difficulty and tolerated well. Medication: triamcinolone acetonide 40 mg/mL suspension for injection 1 mL total and 2 mL lidocaine 1% without epinephrine utilized for needle placement anesthetic Advised to contact for fevers/chills, erythema, induration, drainage, or  persistent bleeding.   Pertinent History, Exam, Impression, and Recommendations:   Primary osteoarthritis of right hip Patient with continued discomfort due to right hip, has been compliant with diclofenac but has only managed 1 physical therapy session citing time constraints.  His pain has progressively worsened since the last visit.  We reviewed various treatment strategies and he did elect to proceed with  ultrasound-guided right intra-articular hip injection, he tolerated the procedure well.  He is amenable to returning to physical therapy, additionally have written for meloxicam for him to dose as an adjunct on a as needed basis.  He is to provide a status update to [place] via MyChart or by stopping by in 2 weeks.  If persistent symptomatology that appears extra-articular as noted, additional injections can be coordinated.    Orders & Medications Meds ordered this encounter  Medications   meloxicam (MOBIC) 15 MG tablet    Sig: Take 1 tablet (15 mg total) by mouth daily as needed for pain.    Dispense:  30 tablet    Refill:  0   Orders Placed This Encounter  Procedures   [place] LIMITED JOINT SPACE STRUCTURES LOW RIGHT   [place] LIMITED JOINT SPACE STRUCTURES LOW RIGHT     Return for Provide status update (MyChart or stop by) in 2 weeks.     Jerrol Banana, MD   Primary Care Sports Medicine [facility]

## 2020-09-14 NOTE — Assessment & Plan Note (Signed)
Patient with continued discomfort due to right hip, has been compliant with diclofenac but has only managed 1 physical therapy session citing time constraints.  His pain has progressively worsened since the last visit.  We reviewed various treatment strategies and he did elect to proceed with ultrasound-guided right intra-articular hip injection, he tolerated the procedure well.  He is amenable to returning to physical therapy, additionally have written for meloxicam for him to dose as an adjunct on a as needed basis.  He is to provide a status update to [place] via MyChart or by stopping by in 2 weeks.  If persistent symptomatology that appears extra-articular as noted, additional injections can be coordinated.

## 2020-09-14 NOTE — Patient Instructions (Signed)
You have just been given a cortisone injection to reduce pain and inflammation. After the injection you may notice immediate relief of pain as a result of the Lidocaine. It is important to rest the area of the injection for 24 to 48 hours after the injection. There is a possibility of some temporary increased discomfort and swelling for up to 72 hours until the cortisone begins to work. If you do have pain, simply rest the joint and use ice. If you can tolerate over the counter medications, you can try Tylenol, Aleve, or Advil for added relief per package instructions. - Stop diclofenac - Start meloxicam once daily with food on an as-needed basis - Continue PT - Provide status update (MyChart or stop by) in 2 weeks

## 2020-09-16 MED ORDER — TRIAMCINOLONE ACETONIDE 40 MG/ML IJ SUSP
40.0000 mg | Freq: Once | INTRAMUSCULAR | Status: AC
  Administered 2020-09-14: 40 mg

## 2020-09-16 MED FILL — Sildenafil Citrate Tab 100 MG: ORAL | 30 days supply | Qty: 6 | Fill #3 | Status: AC

## 2020-09-16 NOTE — Addendum Note (Signed)
Addended by: Lennart Pall on: 09/16/2020 10:21 AM   Modules accepted: Orders

## 2020-09-21 ENCOUNTER — Other Ambulatory Visit: Payer: Self-pay

## 2020-09-23 ENCOUNTER — Encounter: Payer: No Typology Code available for payment source | Admitting: Physical Therapy

## 2020-09-27 ENCOUNTER — Encounter: Payer: Self-pay | Admitting: Family Medicine

## 2020-09-27 DIAGNOSIS — M5440 Lumbago with sciatica, unspecified side: Secondary | ICD-10-CM

## 2020-09-27 DIAGNOSIS — M5136 Other intervertebral disc degeneration, lumbar region: Secondary | ICD-10-CM

## 2020-09-27 DIAGNOSIS — M1611 Unilateral primary osteoarthritis, right hip: Secondary | ICD-10-CM

## 2020-09-28 ENCOUNTER — Other Ambulatory Visit: Payer: Self-pay

## 2020-09-28 DIAGNOSIS — M1611 Unilateral primary osteoarthritis, right hip: Secondary | ICD-10-CM

## 2020-09-28 NOTE — Progress Notes (Signed)
Ref placed to Dr Yevette Edwards

## 2020-09-30 ENCOUNTER — Encounter: Payer: Self-pay | Admitting: Family Medicine

## 2020-10-01 ENCOUNTER — Ambulatory Visit
Admission: RE | Admit: 2020-10-01 | Discharge: 2020-10-01 | Disposition: A | Discharge status: Home / Self Care | Payer: No Typology Code available for payment source | Source: Ambulatory Visit | Attending: Family Medicine | Admitting: Family Medicine

## 2020-10-01 ENCOUNTER — Ambulatory Visit: Payer: No Typology Code available for payment source | Admitting: Family Medicine

## 2020-10-01 ENCOUNTER — Other Ambulatory Visit: Payer: Self-pay

## 2020-10-01 ENCOUNTER — Ambulatory Visit
Admission: RE | Admit: 2020-10-01 | Discharge: 2020-10-01 | Disposition: A | Discharge status: Home / Self Care | Payer: No Typology Code available for payment source | Attending: Family Medicine | Admitting: Family Medicine

## 2020-10-01 ENCOUNTER — Other Ambulatory Visit: Payer: Self-pay | Admitting: Family Medicine

## 2020-10-01 DIAGNOSIS — M5136 Other intervertebral disc degeneration, lumbar region: Secondary | ICD-10-CM

## 2020-10-01 MED ORDER — TIZANIDINE HCL 4 MG PO TABS
4.0000 mg | ORAL_TABLET | Freq: Every evening | ORAL | 0 refills | Status: AC
  Filled 2020-10-01: qty 10, 10d supply, fill #0

## 2020-10-01 MED ORDER — PREDNISONE 50 MG PO TABS
50.0000 mg | ORAL_TABLET | Freq: Every day | ORAL | 0 refills | Status: DC
  Filled 2020-10-01: qty 5, 5d supply, fill #0

## 2020-10-01 MED ORDER — TRAMADOL HCL 50 MG PO TABS
50.0000 mg | ORAL_TABLET | Freq: Three times a day (TID) | ORAL | 0 refills | Status: DC | PRN
  Filled 2020-10-01: qty 15, 5d supply, fill #0

## 2020-10-01 NOTE — Telephone Encounter (Signed)
FYI

## 2020-10-04 ENCOUNTER — Ambulatory Visit (INDEPENDENT_AMBULATORY_CARE_PROVIDER_SITE_OTHER): Payer: No Typology Code available for payment source | Admitting: Family Medicine

## 2020-10-04 ENCOUNTER — Other Ambulatory Visit: Payer: Self-pay

## 2020-10-04 ENCOUNTER — Encounter: Payer: Self-pay | Admitting: Family Medicine

## 2020-10-04 VITALS — BP 132/82 | HR 75 | Temp 98.0°F | Ht 74.0 in | Wt 229.0 lb

## 2020-10-04 DIAGNOSIS — M5440 Lumbago with sciatica, unspecified side: Secondary | ICD-10-CM | POA: Diagnosis not present

## 2020-10-04 DIAGNOSIS — M533 Sacrococcygeal disorders, not elsewhere classified: Secondary | ICD-10-CM | POA: Diagnosis not present

## 2020-10-04 DIAGNOSIS — M7061 Trochanteric bursitis, right hip: Secondary | ICD-10-CM

## 2020-10-04 MED ORDER — CELECOXIB 200 MG PO CAPS
ORAL_CAPSULE | ORAL | 2 refills | Status: DC
  Filled 2020-10-04: qty 60, 30d supply, fill #0

## 2020-10-04 MED ORDER — HYDROCODONE-ACETAMINOPHEN 5-325 MG PO TABS
1.0000 | ORAL_TABLET | Freq: Three times a day (TID) | ORAL | 0 refills | Status: DC | PRN
  Filled 2020-10-04: qty 15, 5d supply, fill #0

## 2020-10-04 NOTE — Assessment & Plan Note (Signed)
Patient with 30+ year history of intermittent low back pain in the setting of prior herniated disc and associated at least partial discectomy.  He has noted progressive worsening over the past several weeks in the low back with involvement in the upper anterolateral thigh, no overt weakness other than secondary to pain.  Of note, recently being evaluated and treated for right groin pain.  Radiographs reveal focal intervertebral narrowing and degenerative changes at L5-S1, this is most consistent with a surgical history, this is noted to a lesser extent at T12-L1, L1-L2.  That being said, his examination shows focal tenderness that is essentially representative of his pain at the left sacroiliac joint, right greater than left greater trochanteric bursa pain, and positive piriformis testing, negative straight leg raise bilaterally, equivocal axial loading of the lumbar spine, and significant deconditioning/tightness throughout the deep hip/core musculature.  Sensorimotor function is otherwise intact in bilateral lower extremities.  I have advised diagnostic and therapeutic injections to be considered, in the interim escalation of pharmacotherapy for as needed pain control, and we will proceed with MR studies of the lumbar spine and left hip to evaluate for neural impingement given his history of chronicity and surgery, left hip MRI to further evaluate deep hip muscular structures especially the piriformis and sciatic nerve.  He will return once scheduled.

## 2020-10-04 NOTE — Patient Instructions (Signed)
-   Return tomorrow as scheduled - Once prednisone complete, start Celebrex and dose continuously - Can use muscle relaxer and hydrocodone as-needed - Activity as tolerated

## 2020-10-04 NOTE — Progress Notes (Signed)
Primary Care / Sports Medicine Office Visit  Patient Information:  Patient ID: Cody [name], male DOB: Apr 08, 1970 Age: 50 y.o. MRN: [id number]   Cody [name] is a pleasant 50 y.o. male presenting with the following:  Chief Complaint  Patient presents with   Back Pain    Lower, x2 weeks; X-Ray 10/01/20; history of ruptured disc about 30 years ago during an accident lifting something heavy per patient; no known injury for this recent pain except for exercises for hip pain; taking tizanidine, tramadol 50-100 mg, prednisone, with minimal relief; 5/10 pain    Review of Systems pertinent details above   Patient Active Problem List   Diagnosis Date Noted   Sacroiliac joint dysfunction of left side 10/04/2020   Back pain of lumbosacral region with sciatica 10/04/2020   Greater trochanteric bursitis, right 10/04/2020   Primary osteoarthritis of right hip 07/06/2020   Gluteal tendinitis, right hip 06/22/2020   Onychomycosis 09/12/2015   CN (constipation) 01/02/2014   Past Medical History:  Diagnosis Date   Hypertension    Outpatient Encounter Medications as of 10/04/2020  Medication Sig   celecoxib (CELEBREX) 200 MG capsule One to 2 tablets by mouth daily as needed for pain.   HYDROcodone-acetaminophen (NORCO/VICODIN) 5-325 MG tablet Take 1 tablet by mouth every 8 (eight) hours as needed for moderate pain.   predniSONE (DELTASONE) 50 MG tablet Take 1 tablet (50 mg total) by mouth daily.   tiZANidine (ZANAFLEX) 4 MG tablet Take 1 tablet (4 mg total) by mouth Nightly for 10 days.   [DISCONTINUED] traMADol (ULTRAM) 50 MG tablet Take 1 tablet (50 mg total) by mouth every 8 (eight) hours as needed for up to 5 days.   hydrochlorothiazide (HYDRODIURIL) 12.5 MG tablet Take 1 tablet (12.5 mg total) by mouth daily. (Patient not taking: No sig reported)   sildenafil (VIAGRA) 100 MG tablet TAKE 1 TABLET (100 MG TOTAL) BY MOUTH DAILY AS NEEDED FOR ERECTILE DYSFUNCTION. (Patient not taking: No sig  reported)   valsartan (DIOVAN) 80 MG tablet Take 1 tablet (80 mg total) by mouth daily. (Patient not taking: No sig reported)   [DISCONTINUED] cyclobenzaprine (FLEXERIL) 10 MG tablet Take 1 tablet (10 mg total) by mouth 3 (three) times daily as needed for muscle spasms. (Patient not taking: Reported on 09/14/2020)   [DISCONTINUED] meloxicam (MOBIC) 15 MG tablet Take 1 tablet (15 mg total) by mouth daily as needed for pain. (Patient not taking: Reported on 10/04/2020)   No facility-administered encounter medications on file as of 10/04/2020.   Past Surgical History:  Procedure Laterality Date   APPENDECTOMY  2005   incidental at time of colon surgery.   BACK SURGERY  2000   COLON SURGERY  2005   Left colectomy for redundant colon with severe constipation.   COLONOSCOPY     COLONOSCOPY WITH PROPOFOL N/A 06/09/2020   Procedure: COLONOSCOPY WITH PROPOFOL;  Surgeon: Earline Mayotte, MD;  Location: [facility] ENDOSCOPY;  Service: Endoscopy;  Laterality: N/A;   HERNIA REPAIR  12/16/2003   ventral hernia repair with Compsix Kugel mesh    Vitals:   10/04/20 1321  BP: 132/82  Pulse: 75  Temp: 98 F (36.7 C)  SpO2: 95%   Vitals:   10/04/20 1321  Weight: 229 lb (103.9 kg)  Height: 6\' 2"  (1.88 m)   Body mass index is 29.4 kg/m.  DG Lumbar Spine Complete  Result Date: 10/03/2020 CLINICAL DATA:  Lumbar degenerative disc disease. Severe left-sided lumbar pain. History  of surgery. EXAM: LUMBAR SPINE - COMPLETE 4+ VIEW COMPARISON:  None. FINDINGS: Five lumbar type vertebra. Slight straightening of normal lordosis. No listhesis. Mild disc space narrowing at L5-S1. Minor endplate spurring at L3-L4 and L4-L5. Minimal facet hypertrophy at L5-S1. Vertebral body heights are normal. No evidence of fracture, pars defects, or focal bone abnormality. Mild degenerative change of both sacroiliac joints which are congruent. IMPRESSION: 1. Mild degenerative disc disease and facet hypertrophy at L5-S1. 2. Additional  spondylosis with endplate spurring at L3-L4 and L4-L5. 3. No acute findings. Electronically Signed   By: Narda Rutherford M.D.   On: 10/03/2020 15:07   [place] LIMITED JOINT SPACE STRUCTURES LOW RIGHT  Result Date: 09/14/2020 Procedure:  Injection of right intra-articular hip under ultrasound guidance. Ultrasound guidance utilized for needle placement, femoral head/neck junction visualized Samsung HS60 device utilized with permanent recording / reporting. Consent obtained and verified. Skin prepped in a sterile fashion. Ethyl chloride spray for topical local analgesia. Completed without difficulty and tolerated well. Medication: triamcinolone acetonide 40 mg/mL suspension for injection 1 mL total and 2 mL lidocaine 1% without epinephrine utilized for needle placement anesthetic Advised to contact for fevers/chills, erythema, induration, drainage, or persistent bleeding.     Independent interpretation of notes and tests performed by another provider:   Independent interpretation of lumbar spine x-rays dated 10/03/2020 radiographs are reviewed reveal focal intervertebral narrowing and degenerative changes at L5-S1, this is most consistent with a surgical history, this is noted to a lesser extent at T12-L1, L1-L2.  Procedures performed:   None  Pertinent History, Exam, Impression, and Recommendations:   Back pain of lumbosacral region with sciatica Patient with 30+ year history of intermittent low back pain in the setting of prior herniated disc and associated at least partial discectomy.  He has noted progressive worsening over the past several weeks in the low back with involvement in the upper anterolateral thigh, no overt weakness other than secondary to pain.  Of note, recently being evaluated and treated for right groin pain.  Radiographs reveal focal intervertebral narrowing and degenerative changes at L5-S1, this is most consistent with a surgical history, this is noted to a lesser extent at T12-L1,  L1-L2.  That being said, his examination shows focal tenderness that is essentially representative of his pain at the left sacroiliac joint, right greater than left greater trochanteric bursa pain, and positive piriformis testing, negative straight leg raise bilaterally, equivocal axial loading of the lumbar spine, and significant deconditioning/tightness throughout the deep hip/core musculature.  Sensorimotor function is otherwise intact in bilateral lower extremities.  I have advised diagnostic and therapeutic injections to be considered, in the interim escalation of pharmacotherapy for as needed pain control, and we will proceed with MR studies of the lumbar spine and left hip to evaluate for neural impingement given his history of chronicity and surgery, left hip MRI to further evaluate deep hip muscular structures especially the piriformis and sciatic nerve.  He will return once scheduled.  Sacroiliac joint dysfunction of left side See additional assessment(s) for plan details.  Greater trochanteric bursitis, right See additional assessment(s) for plan details.    Orders & Medications Meds ordered this encounter  Medications   celecoxib (CELEBREX) 200 MG capsule    Sig: One to 2 tablets by mouth daily as needed for pain.    Dispense:  60 capsule    Refill:  2   HYDROcodone-acetaminophen (NORCO/VICODIN) 5-325 MG tablet    Sig: Take 1 tablet by mouth every 8 (eight)  hours as needed for moderate pain.    Dispense:  15 tablet    Refill:  0   Orders Placed This Encounter  Procedures   MR LUMBAR SPINE W WO CONTRAST   MR HIP LEFT WO CONTRAST     Return in about 1 day (around 10/05/2020) for AM timeframe 40 minute slot.     Jerrol Banana, MD   Primary Care Sports Medicine [facility]

## 2020-10-04 NOTE — Assessment & Plan Note (Signed)
See additional assessment(s) for plan details. 

## 2020-10-05 ENCOUNTER — Inpatient Hospital Stay (INDEPENDENT_AMBULATORY_CARE_PROVIDER_SITE_OTHER): Payer: No Typology Code available for payment source | Admitting: Radiology

## 2020-10-05 ENCOUNTER — Encounter: Payer: Self-pay | Admitting: Family Medicine

## 2020-10-05 ENCOUNTER — Ambulatory Visit (INDEPENDENT_AMBULATORY_CARE_PROVIDER_SITE_OTHER): Payer: No Typology Code available for payment source | Admitting: Family Medicine

## 2020-10-05 VITALS — BP 118/74 | HR 75 | Temp 97.7°F | Ht 74.0 in | Wt 229.0 lb

## 2020-10-05 DIAGNOSIS — M5440 Lumbago with sciatica, unspecified side: Secondary | ICD-10-CM | POA: Diagnosis not present

## 2020-10-05 DIAGNOSIS — M533 Sacrococcygeal disorders, not elsewhere classified: Secondary | ICD-10-CM

## 2020-10-05 DIAGNOSIS — M7061 Trochanteric bursitis, right hip: Secondary | ICD-10-CM

## 2020-10-05 DIAGNOSIS — G5702 Lesion of sciatic nerve, left lower limb: Secondary | ICD-10-CM

## 2020-10-05 MED ORDER — TRIAMCINOLONE ACETONIDE 40 MG/ML IJ SUSP
120.0000 mg | Freq: Once | INTRAMUSCULAR | Status: AC
  Administered 2020-10-05: 120 mg

## 2020-10-05 NOTE — Patient Instructions (Addendum)
You have just been given a cortisone injection to reduce pain and inflammation. After the injection you may notice immediate relief of pain as a result of the Lidocaine. It is important to rest the area of the injection for 24 to 48 hours after the injection. There is a possibility of some temporary increased discomfort and swelling for up to 72 hours until the cortisone begins to work. If you do have pain, simply rest the joint and use ice. If you can tolerate over the counter medications, you can try Tylenol, Aleve, or Advil for added relief per package instructions. - Use antiinflammatory Celebrex (celecoxib) tablets every 12 hours scheduled - Consider scheduled bedtime dosing of tizanidine (muscle relaxer) - Dose hydrocodone-acetaminophen (pain medication) every 8 hours as-needed - We will contact you after MRI results available

## 2020-10-05 NOTE — Assessment & Plan Note (Addendum)
Given persistence of pain for this chronic issue with ongoing exacerbation, patient did elect to proceed with ultrasound-guided left sacroiliac joint injection, piriformis trigger point injection, and right greater trochanteric bursa injection.  He does have upcoming MRI scheduled for Monday, pending MRI results and response to injections, next surgical versus nonsurgical steps to be discussed.  In the interim medication management reviewed and he was advised to dose Celebrex scheduled as opposed to as-needed, utilize hydrocodone and tizanidine on an as-needed basis.

## 2020-10-05 NOTE — Progress Notes (Signed)
Primary Care / Sports Medicine Office Visit  Patient Information:  Patient ID: Cody [name], male DOB: 03-16-1970 Age: 50 y.o. MRN: [id number]   Cody [name] is a pleasant 50 y.o. male presenting with the following:  Chief Complaint  Patient presents with   Sacroiliac joint dysfunction of left side    Possible injection today in office, medications ineffective; 6/10 pain    Review of Systems pertinent details above   Patient Active Problem List   Diagnosis Date Noted   Piriformis syndrome, left 10/05/2020   Sacroiliac joint dysfunction of left side 10/04/2020   Back pain of lumbosacral region with sciatica 10/04/2020   Greater trochanteric bursitis, right 10/04/2020   Primary osteoarthritis of right hip 07/06/2020   Gluteal tendinitis, right hip 06/22/2020   Onychomycosis 09/12/2015   CN (constipation) 01/02/2014   Past Medical History:  Diagnosis Date   Hypertension    Outpatient Encounter Medications as of 10/05/2020  Medication Sig   celecoxib (CELEBREX) 200 MG capsule One to 2 tablets by mouth daily as needed for pain.   HYDROcodone-acetaminophen (NORCO/VICODIN) 5-325 MG tablet Take 1 tablet by mouth every 8 (eight) hours as needed for moderate pain.   predniSONE (DELTASONE) 50 MG tablet Take 1 tablet (50 mg total) by mouth daily.   tiZANidine (ZANAFLEX) 4 MG tablet Take 1 tablet (4 mg total) by mouth Nightly for 10 days.   hydrochlorothiazide (HYDRODIURIL) 12.5 MG tablet Take 1 tablet (12.5 mg total) by mouth daily. (Patient not taking: No sig reported)   sildenafil (VIAGRA) 100 MG tablet TAKE 1 TABLET (100 MG TOTAL) BY MOUTH DAILY AS NEEDED FOR ERECTILE DYSFUNCTION. (Patient not taking: No sig reported)   valsartan (DIOVAN) 80 MG tablet Take 1 tablet (80 mg total) by mouth daily. (Patient not taking: No sig reported)   [EXPIRED] triamcinolone acetonide (KENALOG-40) injection 120 mg    No facility-administered encounter medications on file as of 10/05/2020.   Past  Surgical History:  Procedure Laterality Date   APPENDECTOMY  2005   incidental at time of colon surgery.   BACK SURGERY  2000   COLON SURGERY  2005   Left colectomy for redundant colon with severe constipation.   COLONOSCOPY     COLONOSCOPY WITH PROPOFOL N/A 06/09/2020   Procedure: COLONOSCOPY WITH PROPOFOL;  Surgeon: Earline Mayotte, MD;  Location: [facility] ENDOSCOPY;  Service: Endoscopy;  Laterality: N/A;   HERNIA REPAIR  12/16/2003   ventral hernia repair with Compsix Kugel mesh    Vitals:   10/05/20 1310  BP: 118/74  Pulse: 75  Temp: 97.7 F (36.5 C)  SpO2: 95%   Vitals:   10/05/20 1310  Weight: 229 lb (103.9 kg)  Height: 6\' 2"  (1.88 m)   Body mass index is 29.4 kg/m.  DG Lumbar Spine Complete  Result Date: 10/03/2020 CLINICAL DATA:  Lumbar degenerative disc disease. Severe left-sided lumbar pain. History of surgery. EXAM: LUMBAR SPINE - COMPLETE 4+ VIEW COMPARISON:  None. FINDINGS: Five lumbar type vertebra. Slight straightening of normal lordosis. No listhesis. Mild disc space narrowing at L5-S1. Minor endplate spurring at L3-L4 and L4-L5. Minimal facet hypertrophy at L5-S1. Vertebral body heights are normal. No evidence of fracture, pars defects, or focal bone abnormality. Mild degenerative change of both sacroiliac joints which are congruent. IMPRESSION: 1. Mild degenerative disc disease and facet hypertrophy at L5-S1. 2. Additional spondylosis with endplate spurring at L3-L4 and L4-L5. 3. No acute findings. Electronically Signed   By: 12/03/2020.D.  On: 10/03/2020 15:07   [place] LIMITED JOINT SPACE STRUCTURES LOW RIGHT  Result Date: 09/14/2020 Procedure:  Injection of right intra-articular hip under ultrasound guidance. Ultrasound guidance utilized for needle placement, femoral head/neck junction visualized Samsung HS60 device utilized with permanent recording / reporting. Consent obtained and verified. Skin prepped in a sterile fashion. Ethyl chloride spray for topical  local analgesia. Completed without difficulty and tolerated well. Medication: triamcinolone acetonide 40 mg/mL suspension for injection 1 mL total and 2 mL lidocaine 1% without epinephrine utilized for needle placement anesthetic Advised to contact for fevers/chills, erythema, induration, drainage, or persistent bleeding.     Independent interpretation of notes and tests performed by another provider:   None  Procedures performed:   Procedure:  Injection of left sacroiliac joint under ultrasound guidance. Ultrasound guidance used for joint visualization and observation of injectate Samsung HS60 device utilized with permanent recording / reporting. Consent obtained and verified. Skin prepped in a sterile fashion. Ethyl chloride spray for topical local analgesia.  Completed without difficulty and tolerated well. Medication: triamcinolone acetonide 40 mg/mL suspension for injection 1 mL total and 2 mL lidocaine 1% without epinephrine utilized for needle placement anesthetic Advised to contact for fevers/chills, erythema, induration, drainage, or persistent bleeding.  Procedure:  Injection of left piriformis trigger point under ultrasound guidance. Ultrasound guidance utilized for evaluation of piriformis at sciatic, needle placement Samsung HS60 device utilized with permanent recording / reporting. Consent obtained and verified. Skin prepped in a sterile fashion. Ethyl chloride spray for topical local analgesia.  Completed without difficulty and tolerated well. Medication: triamcinolone acetonide 40 mg/mL suspension for injection 1 mL total and 2 mL lidocaine 1% without epinephrine utilized for needle placement anesthetic Advised to contact for fevers/chills, erythema, induration, drainage, or persistent bleeding.  Procedure:  Injection of right greater trochanteric bursa under ultrasound guidance. Ultrasound guidance utilized for confirmation of site, visualization of injectate Samsung  HS60 device utilized with permanent recording / reporting. Consent obtained and verified. Skin prepped in a sterile fashion. Ethyl chloride spray for topical local analgesia.  Completed without difficulty and tolerated well. Medication: triamcinolone acetonide 40 mg/mL suspension for injection 1 mL total and 2 mL lidocaine 1% without epinephrine utilized for needle placement anesthetic Advised to contact for fevers/chills, erythema, induration, drainage, or persistent bleeding.   Pertinent History, Exam, Impression, and Recommendations:   Back pain of lumbosacral region with sciatica Given persistence of pain for this chronic issue with ongoing exacerbation, patient did elect to proceed with ultrasound-guided left sacroiliac joint injection, piriformis trigger point injection, and right greater trochanteric bursa injection.  He does have upcoming MRI scheduled for Monday, pending MRI results and response to injections, next surgical versus nonsurgical steps to be discussed.  In the interim medication management reviewed and he was advised to dose Celebrex scheduled as opposed to as-needed, utilize hydrocodone and tizanidine on an as-needed basis.  Piriformis syndrome, left See additional assessment(s) for plan details.  Sacroiliac joint dysfunction of left side See additional assessment(s) for plan details.  Greater trochanteric bursitis, right See additional assessment(s) for plan details.    Orders & Medications Meds ordered this encounter  Medications   triamcinolone acetonide (KENALOG-40) injection 120 mg   Orders Placed This Encounter  Procedures   [place] LIMITED JOINT SPACE STRUCTURES LOW BILAT     No follow-ups on file.     Jerrol Banana, MD   Primary Care Sports Medicine [facility] [place]

## 2020-10-05 NOTE — Assessment & Plan Note (Signed)
See additional assessment(s) for plan details. 

## 2020-10-07 ENCOUNTER — Encounter: Payer: No Typology Code available for payment source | Admitting: Physical Therapy

## 2020-10-08 ENCOUNTER — Other Ambulatory Visit: Payer: Self-pay

## 2020-10-08 ENCOUNTER — Other Ambulatory Visit: Payer: Self-pay | Admitting: Family Medicine

## 2020-10-08 DIAGNOSIS — M7061 Trochanteric bursitis, right hip: Secondary | ICD-10-CM

## 2020-10-08 DIAGNOSIS — M533 Sacrococcygeal disorders, not elsewhere classified: Secondary | ICD-10-CM

## 2020-10-08 DIAGNOSIS — M5440 Lumbago with sciatica, unspecified side: Secondary | ICD-10-CM

## 2020-10-08 MED ORDER — HYDROCODONE-ACETAMINOPHEN 5-325 MG PO TABS
1.0000 | ORAL_TABLET | Freq: Three times a day (TID) | ORAL | 0 refills | Status: DC | PRN
  Filled 2020-10-08: qty 15, 5d supply, fill #0

## 2020-10-08 NOTE — Telephone Encounter (Signed)
Will have to check with pain clinic next week since they are closed, but I know they are booked out at least a month or more. Routing back to myself as a reminder.  For your information.

## 2020-10-08 NOTE — Telephone Encounter (Signed)
Please review.  KP

## 2020-10-08 NOTE — Progress Notes (Signed)
    Primary Care / Sports Medicine Telephone Note  Patient Information:  Patient ID: Cody [name], male DOB: 06-26-1970 Age: 51 y.o. MRN: [id number]    Patient message regarding lack of response to recent injections in the setting of lumbar spine surgery and recent exacerbation, noted deconditioning throughout hips. Recently prescribed narcotics refilled after PDMP review and MRI of lumbar spine and hip scheduled for 10/11/2020. Additionally, during last visit discussion over pain management to address recalcitrant symptoms and urgent referral placed today to coincide with upcoming MRI. Additional evaluation by spine surgery to be considered pending MRI results.  Jerrol Banana, MD   Primary Care Sports Medicine [facility]

## 2020-10-11 ENCOUNTER — Ambulatory Visit (INDEPENDENT_AMBULATORY_CARE_PROVIDER_SITE_OTHER): Payer: No Typology Code available for payment source

## 2020-10-11 ENCOUNTER — Other Ambulatory Visit: Payer: Self-pay

## 2020-10-11 DIAGNOSIS — M533 Sacrococcygeal disorders, not elsewhere classified: Secondary | ICD-10-CM | POA: Diagnosis not present

## 2020-10-11 DIAGNOSIS — M7061 Trochanteric bursitis, right hip: Secondary | ICD-10-CM

## 2020-10-11 DIAGNOSIS — M5440 Lumbago with sciatica, unspecified side: Secondary | ICD-10-CM

## 2020-10-11 MED ORDER — GADOBUTROL 1 MMOL/ML IV SOLN
10.0000 mL | Freq: Once | INTRAVENOUS | Status: AC | PRN
  Administered 2020-10-11: 10 mL via INTRAVENOUS

## 2020-10-12 NOTE — Telephone Encounter (Signed)
Spoke with [facility] with pain management and advised patient had MRIs of hip and back yesterday.  Referral given to doctor for review to be scheduled.  For your information.

## 2020-10-13 NOTE — Telephone Encounter (Signed)
For your information. Please advise for medication management.  Patient was advised by Dr. Ashley Royalty that no more narcotics would be sent in after last fill on 10/08/20.

## 2020-10-15 ENCOUNTER — Other Ambulatory Visit: Payer: Self-pay

## 2020-10-15 MED ORDER — GABAPENTIN 100 MG PO CAPS
ORAL_CAPSULE | ORAL | 0 refills | Status: DC
  Filled 2020-10-15: qty 70, 14d supply, fill #0

## 2020-10-15 MED ORDER — CYCLOBENZAPRINE HCL 10 MG PO TABS
ORAL_TABLET | ORAL | 0 refills | Status: DC
  Filled 2020-10-15: qty 15, 15d supply, fill #0

## 2020-10-19 ENCOUNTER — Other Ambulatory Visit: Payer: Self-pay

## 2020-10-19 ENCOUNTER — Other Ambulatory Visit: Payer: Self-pay | Admitting: Family Medicine

## 2020-10-19 DIAGNOSIS — M7061 Trochanteric bursitis, right hip: Secondary | ICD-10-CM

## 2020-10-19 DIAGNOSIS — M5440 Lumbago with sciatica, unspecified side: Secondary | ICD-10-CM

## 2020-10-19 DIAGNOSIS — M533 Sacrococcygeal disorders, not elsewhere classified: Secondary | ICD-10-CM

## 2020-10-19 MED ORDER — HYDROCODONE-ACETAMINOPHEN 5-325 MG PO TABS
1.0000 | ORAL_TABLET | Freq: Three times a day (TID) | ORAL | 0 refills | Status: DC | PRN
  Filled 2020-10-19: qty 15, 5d supply, fill #0

## 2020-10-21 ENCOUNTER — Encounter: Payer: No Typology Code available for payment source | Admitting: Physical Therapy

## 2020-10-21 NOTE — Addendum Note (Signed)
Addended by: Lennart Pall on: 10/21/2020 04:16 PM   Modules accepted: Orders

## 2020-10-25 ENCOUNTER — Ambulatory Visit
Payer: No Typology Code available for payment source | Attending: Student in an Organized Health Care Education/Training Program | Admitting: Student in an Organized Health Care Education/Training Program

## 2020-10-25 ENCOUNTER — Other Ambulatory Visit: Payer: Self-pay

## 2020-10-25 ENCOUNTER — Encounter: Payer: Self-pay | Admitting: Student in an Organized Health Care Education/Training Program

## 2020-10-25 VITALS — BP 139/104 | HR 81 | Temp 96.7°F | Resp 16 | Ht 74.0 in | Wt 220.0 lb

## 2020-10-25 DIAGNOSIS — G8929 Other chronic pain: Secondary | ICD-10-CM | POA: Diagnosis present

## 2020-10-25 DIAGNOSIS — M5416 Radiculopathy, lumbar region: Secondary | ICD-10-CM | POA: Diagnosis present

## 2020-10-25 DIAGNOSIS — M47816 Spondylosis without myelopathy or radiculopathy, lumbar region: Secondary | ICD-10-CM | POA: Diagnosis present

## 2020-10-25 DIAGNOSIS — M792 Neuralgia and neuritis, unspecified: Secondary | ICD-10-CM | POA: Diagnosis present

## 2020-10-25 DIAGNOSIS — Z9889 Other specified postprocedural states: Secondary | ICD-10-CM | POA: Diagnosis present

## 2020-10-25 DIAGNOSIS — G894 Chronic pain syndrome: Secondary | ICD-10-CM

## 2020-10-25 MED ORDER — GABAPENTIN 300 MG PO CAPS
ORAL_CAPSULE | ORAL | 2 refills | Status: DC
Start: 2020-10-25 — End: 2021-05-03
  Filled 2020-10-25: qty 90, 22d supply, fill #0
  Filled 2020-11-22: qty 90, 22d supply, fill #1

## 2020-10-25 NOTE — Patient Instructions (Signed)

## 2020-10-25 NOTE — Progress Notes (Signed)
Patient: Cody [name]  Service Category: E/M  Provider: Gillis Santa, MD  DOB: 04/13/1970  DOS: 10/25/2020  Referring Provider: Montel Culver, MD  MRN: [id number]  Setting: Ambulatory outpatient  PCP: Juline Patch, MD  Type: New Patient  Specialty: Interventional Pain Management    Location: Office  Delivery: Face-to-face     Primary Reason(s) for Visit: Encounter for initial evaluation of one or more chronic problems (new to examiner) potentially causing chronic pain, and posing a threat to normal musculoskeletal function. (Level of risk: High) CC: Back Pain (lower)  HPI  Mr. Cody [name] is a 50 y.o. year old, male patient, who comes for the first time to our practice referred by Zigmund Daniel Earley Abide, MD for our initial evaluation of his chronic pain. He has CN (constipation); Onychomycosis; Gluteal tendinitis, right hip; Primary osteoarthritis of right hip; Sacroiliac joint dysfunction of left side; Back pain of lumbosacral region with sciatica; Greater trochanteric bursitis, right; Piriformis syndrome, left; History of lumbar laminectomy; Chronic radicular lumbar pain; Neuropathic pain; Lumbar facet arthropathy; and Chronic pain syndrome on their problem list. Today he comes in for evaluation of his Back Pain (lower)  Pain Assessment: Location: Lower Back Radiating: numbness in left upper leg Onset: More than a month ago Duration: Chronic pain Quality: Sharp, Constant Severity: 3 /10 (subjective, self-reported pain score)  Effect on ADL: difficulty driving because of sitting for long periods of time Timing: Constant Modifying factors: standing, lying on right side BP: (!) 139/104  HR: 81  Onset and Duration: Sudden, Gradual, and Present longer than 3 months Cause of pain: Unknown Severity: No change since onset, NAS-11 at its worse: 10/10, NAS-11 at its best: .2/10, NAS-11 now: 3/10, and NAS-11 on the average: 5/10 Timing: Morning, Afternoon, and Night Aggravating Factors: Bending, Bowel  movements, Eating, Kneeling, Lifiting, Prolonged sitting, Squatting, and Stooping  Alleviating Factors: Standing Associated Problems: Constipation, Fatigue, Numbness, Pain that wakes patient up, and Pain that does not allow patient to sleep Quality of Pain: Aching, Agonizing, Annoying, Burning, Constant, Deep, Distressing, Nagging, Pressure-like, Sharp, and Uncomfortable Previous Examinations or Tests: MRI scan and X-rays Previous Treatments: Narcotic medications, Steroid treatments by mouth, Strengthening exercises, and Stretching exercises  Cody [name] is a pleasant 50 year old male with a past medical history of lumbar spine surgery performed over 20 years ago who presents with a chief complaint of low back pain with numbness of his left upper and lateral leg.  This is been going on for the last 2 months.  No inciting or traumatic event.  He has been evaluated by Dr. Zigmund Daniel with sports medicine.  There he has done a left SI joint injection, left piriformis injection, left greater trochanteric bursa injection.  Patient states that this was not helpful and in fact made his pain worse.  He points to his left L5-S1 region and states that it is a focal area of his pain.  He rarely has pain in his leg but has been dealing with worsening numbness of his left leg.  He states that gabapentin has been helping, he takes 100 mg during the day, 100 mg in the afternoon, 200 mg at night.  No side effects with this medication.  He states that Dr. Zigmund Daniel also prescribed a short quantity of hydrocodone which was not helpful.  He recalls oxycodone being helpful after a surgery.  I informed patient that we will focus on physical therapy, interventional pain management and nonopioid-based pain therapies.  He states that he has an appointment with  a spine specialist tomorrow.  I informed him of our clinic policy regarding chronic pain management.  While I do not recommend chronic opioid therapy for his condition, to be  considered for COT, all patient's new baseline urine toxicology screen and psych assessment.  Historic Controlled Substance Pharmacotherapy Review   Historical Monitoring: The patient  reports no history of drug use. List of all UDS Test(s): No results found for: MDMA, COCAINSCRNUR, [place], [place], CANNABQUANT, THCU, [place] List of other Serum/Urine Drug Screening Test(s):  No results found for: AMPHSCRSER, BARBSCRSER, BENZOSCRSER, COCAINSCRSER, COCAINSCRNUR, PCPSCRSER, PCPQUANT, THCSCRSER, THCU, CANNABQUANT, OPIATESCRSER, OXYSCRSER, PROPOXSCRSER, ETH Historical Background Evaluation: [place] PMP: PDMP reviewed during this encounter. Online review of the past 63-monthperiod conducted.              [place] Department of public safety, offender search: (Editor, commissioningInformation) Non-contributory Risk Assessment Profile: Aberrant behavior: None observed or detected today Risk factors for fatal opioid overdose: age [age]years old Fatal overdose hazard ratio (HR): Calculation deferred Non-fatal overdose hazard ratio (HR): Calculation deferred Risk of opioid abuse or dependence: 0.7-3.0% with doses ? 36 MME/day and 6.1-26% with doses ? 120 MME/day. Substance use disorder (SUD) risk level: See below Personal History of Substance Abuse (SUD-Substance use disorder):  Alcohol: Negative  Illegal Drugs: Negative  Rx Drugs: Negative  ORT Risk Level calculation: Low Risk  Opioid Risk Tool - 10/25/20 1307       Family History of Substance Abuse   Alcohol Negative    Illegal Drugs Negative    Rx Drugs Negative      Personal History of Substance Abuse   Alcohol Negative    Illegal Drugs Negative    Rx Drugs Negative      Age   Age between 165-45years  No      History of Preadolescent Sexual Abuse   History of Preadolescent Sexual Abuse Negative or Male      Psychological Disease   Psychological Disease Negative    Depression Negative      Total Score   Opioid Risk Tool Scoring 0    Opioid Risk  Interpretation Low Risk            ORT Scoring interpretation table:  Score <3 = Low Risk for SUD  Score between 4-7 = Moderate Risk for SUD  Score >8 = High Risk for Opioid Abuse   PHQ-2 Depression Scale:  Total score: 0  PHQ-2 Scoring interpretation table: (Score and probability of major depressive disorder)  Score 0 = No depression  Score 1 = 15.4% Probability  Score 2 = 21.1% Probability  Score 3 = 38.4% Probability  Score 4 = 45.5% Probability  Score 5 = 56.4% Probability  Score 6 = 78.6% Probability   PHQ-9 Depression Scale:  Total score: 0  PHQ-9 Scoring interpretation table:  Score 0-4 = No depression  Score 5-9 = Mild depression  Score 10-14 = Moderate depression  Score 15-19 = Moderately severe depression  Score 20-27 = Severe depression (2.4 times higher risk of SUD and 2.89 times higher risk of overuse)   Pharmacologic Plan: As per protocol, I have not taken over any controlled substance management, pending the results of ordered tests and/or consults.            Initial impression: Pending review of available data and ordered tests.  I informed the patient that to be considered for chronic opioid therapy that we will need a baseline urine toxicology screen and referral to psychologist  for risk assessment.  He states that he wants to hold off on this.  Meds   Current Outpatient Medications:    celecoxib (CELEBREX) 200 MG capsule, One to 2 tablets by mouth daily as needed for pain., Disp: 60 capsule, Rfl: 2   cyclobenzaprine (FLEXERIL) 10 MG tablet, Take 1 tab PO nightly PRN., Disp: 15 tablet, Rfl: 0   gabapentin (NEURONTIN) 300 MG capsule, 300 mg qAM, 300 mg qPM, 600 mg QHS, Disp: 90 capsule, Rfl: 2  Imaging Review    MR SHOULDER LEFT WO CONTRAST  Narrative CLINICAL DATA:  Chronic pain in both shoulders.  EXAM: MRI OF THE LEFT SHOULDER WITHOUT CONTRAST  TECHNIQUE: Multiplanar, multisequence MR imaging of the shoulder was performed. No intravenous  contrast was administered.  COMPARISON:  None.  FINDINGS: Rotator cuff: Moderate tendinosis of the supraspinatus tendon with a small partial-thickness articular surface tear of the anterior-most aspect. Infraspinatus tendon is intact. Teres minor tendon is intact. Subscapularis tendon is intact.  Muscles: No atrophy or fatty replacement of nor abnormal signal within, the muscles of the rotator cuff.  Biceps long head:  Intact.  Acromioclavicular Joint: Moderate arthropathy of the acromioclavicular joint. Type II acromion. Small amount of subacromial/subdeltoid bursal fluid.  Glenohumeral Joint: No chondral defect.  No joint effusion.  Labrum: Grossly intact, but evaluation is limited by lack of intraarticular fluid.  Bones:  No acute osseous abnormality.  No aggressive osseous lesion.  Other: No fluid collection or hematoma.  IMPRESSION: 1. Moderate tendinosis of the supraspinatus tendon with a small partial-thickness articular surface tear of the anterior fibers. 2. Mild subacromial/subdeltoid bursitis. Moderate arthropathy of the acromioclavicular joint with marrow edema on either side of the joint.   Electronically Signed By: Kathreen Devoid On: 05/02/2017 10:13  Sh MR LUMBAR SPINE W WO CONTRAST  Narrative CLINICAL DATA:  Low back pain, numbness of the left leg  EXAM: MRI LUMBAR SPINE WITHOUT AND WITH CONTRAST  TECHNIQUE: Multiplanar and multiecho pulse sequences of the lumbar spine were obtained without and with intravenous contrast.  CONTRAST:  90m GADAVIST GADOBUTROL 1 MMOL/ML IV SOLN  COMPARISON:  09/20/2009  FINDINGS: Segmentation:  Standard.  Alignment:  2 mm retrolisthesis of L5 on S1.  Vertebrae: No acute fracture, evidence of discitis, or aggressive bone lesion.  Conus medullaris and cauda equina: Conus extends to the T12-L1 level. Conus and cauda equina appear normal.  Paraspinal and other soft tissues: No acute paraspinal  abnormality.  Disc levels:  Disc spaces: Degenerative disease with disc height loss at L5-S1.  T12-L1: No significant disc bulge. No neural foraminal stenosis. No central canal stenosis.  L1-L2: Tiny right paracentral disc protrusion. No foraminal or central canal stenosis.  L2-L3: No significant disc bulge. No neural foraminal stenosis. No central canal stenosis.  L3-L4: Minimal broad-based disc bulge. Mild bilateral facet arthropathy. No foraminal or central canal stenosis.  L4-L5: Broad-based disc bulge eccentric towards the right with a right foraminal annular fissure. No foraminal or central canal stenosis.  L5-S1: Mild broad-based disc bulge with small focal area of left paracentral epidural fibrosis. Mild bilateral facet arthropathy. No foraminal or central canal stenosis.  IMPRESSION: 1. Lumbar spine spondylosis as described above. No evidence of nerve root impingement. 2.  No acute osseous injury of the lumbar spine.   Electronically Signed By: HKathreen DevoidM.D. On: 10/11/2020 15:20 CLINICAL DATA:  Lumbar degenerative disc disease. Severe left-sided lumbar pain. History of surgery.  EXAM: LUMBAR SPINE - COMPLETE 4+ VIEW  COMPARISON:  None.  FINDINGS: Five lumbar type vertebra. Slight straightening of normal lordosis. No listhesis. Mild disc space narrowing at L5-S1. Minor endplate spurring at H6-O3 and L4-L5. Minimal facet hypertrophy at L5-S1. Vertebral body heights are normal. No evidence of fracture, pars defects, or focal bone abnormality. Mild degenerative change of both sacroiliac joints which are congruent.  IMPRESSION: 1. Mild degenerative disc disease and facet hypertrophy at L5-S1. 2. Additional spondylosis with endplate spurring at F2-B0 and L4-L5. 3. No acute findings.   Electronically Signed By: Keith Rake M.D. On: 10/03/2020 15:07  Narrative CLINICAL DATA:  Left hip pain.  Left groin pain.  EXAM: MR OF THE LEFT HIP WITHOUT  CONTRAST  TECHNIQUE: Multiplanar, multisequence MR imaging was performed. No intravenous contrast was administered.  COMPARISON:  None.  FINDINGS: Bones:  No hip fracture, dislocation or avascular necrosis.  No periosteal reaction or bone destruction. No aggressive osseous lesion.  Mild osteoarthritis of bilateral sacroiliac joints. No SI joint widening or erosive changes.  Degenerative disease with disc height loss at L4-5 and L5-S1.  Articular cartilage and labrum  Articular cartilage: Partial-thickness cartilage loss of the right femoral head and acetabulum. Mild partial-thickness cartilage loss of the left femoral head and acetabulum.  Labrum: Limited evaluation of the labrum secondary lack of intra-articular contrast. Left labral degeneration. Right superior anterior labral tear.  Joint or bursal effusion  Joint effusion:  No hip joint effusion.  No SI joint effusion.  Bursae:  No bursa formation.  Muscles and tendons  Flexors: Normal.  Extensors: Normal.  Abductors: Normal.  Adductors: Normal.  Gluteals: Normal.  Hamstrings: Normal.  Other findings  No pelvic free fluid. No fluid collection or hematoma. No inguinal lymphadenopathy. No inguinal hernia.  IMPRESSION: 1. Mild osteoarthritis of the left hip. 2. Moderate osteoarthritis of the right hip. 3. Mild osteoarthritis of bilateral SI joints.   Electronically Signed By: Kathreen Devoid M.D. On: 10/11/2020 14:52  Narrative CLINICAL DATA:  Right anterolateral hip and groin pain which is worse with weight-bearing. No known injury.  EXAM: DG HIP (WITH OR WITHOUT PELVIS) 2-3V RIGHT  COMPARISON:  None.  FINDINGS: There is no acute bony or joint abnormality. The patient has right hip osteoarthritis with near bone-on-bone joint space narrowing superiorly and some osteophytosis. Small subchondral cyst in the acetabulum is noted. Left hip joint space is preserved. No avascular necrosis of the  femoral heads. No lytic or sclerotic lesion. Soft tissues are negative.  IMPRESSION: Moderate to moderately severe right hip osteoarthritis. The exam is otherwise negative.   Electronically Signed By: Inge Rise M.D. On: 06/23/2020 10:20  Complexity Note: Imaging results reviewed. Results shared with Mr. Doyel, using Layman's terms.                         ROS  Cardiovascular: High blood pressure Pulmonary or Respiratory: No reported pulmonary signs or symptoms such as wheezing and difficulty taking a deep full breath (Asthma), difficulty blowing air out (Emphysema), coughing up mucus (Bronchitis), persistent dry cough, or temporary stoppage of breathing during sleep Neurological: No reported neurological signs or symptoms such as seizures, abnormal skin sensations, urinary and/or fecal incontinence, being born with an abnormal open spine and/or a tethered spinal cord Psychological-Psychiatric: No reported psychological or psychiatric signs or symptoms such as difficulty sleeping, anxiety, depression, delusions or hallucinations (schizophrenial), mood swings (bipolar disorders) or suicidal ideations or attempts Gastrointestinal: No reported gastrointestinal signs or symptoms such as vomiting or evacuating blood, reflux,  heartburn, alternating episodes of diarrhea and constipation, inflamed or scarred liver, or pancreas or irrregular and/or infrequent bowel movements Genitourinary: No reported renal or genitourinary signs or symptoms such as difficulty voiding or producing urine, peeing blood, non-functioning kidney, kidney stones, difficulty emptying the bladder, difficulty controlling the flow of urine, or chronic kidney disease Hematological: No reported hematological signs or symptoms such as prolonged bleeding, low or poor functioning platelets, bruising or bleeding easily, hereditary bleeding problems, low energy levels due to low hemoglobin or being anemic Endocrine: No reported  endocrine signs or symptoms such as high or low blood sugar, rapid heart rate due to high thyroid levels, obesity or weight gain due to slow thyroid or thyroid disease Rheumatologic: No reported rheumatological signs and symptoms such as fatigue, joint pain, tenderness, swelling, redness, heat, stiffness, decreased range of motion, with or without associated rash Musculoskeletal: Negative for myasthenia gravis, muscular dystrophy, multiple sclerosis or malignant hyperthermia Work History: Working full time  Allergies  Mr. Foss has No Known Allergies.  Laboratory Chemistry Profile   Renal Lab Results  Component Value Date   BUN 15 04/27/2020   CREATININE 1.16 04/27/2020   BCR 13 04/27/2020   GFRAA 73 10/31/2017   GFRNONAA 63 10/31/2017     Electrolytes Lab Results  Component Value Date   NA 141 04/27/2020   K 4.7 04/27/2020   CL 104 04/27/2020   CALCIUM 9.5 04/27/2020   PHOS 3.1 04/27/2020     Hepatic Lab Results  Component Value Date   AST 26 09/15/2015   ALT 24 09/15/2015   ALBUMIN 4.7 04/27/2020   ALKPHOS 46 09/15/2015     ID No results found for: LYMEIGGIGMAB, HIV, SARSCOV2NAA, STAPHAUREUS, MRSAPCR, HCVAB, PREGTESTUR, RMSFIGG, QFVRPH1IGG, QFVRPH2IGG, LYMEIGGIGMAB   Bone Lab Results  Component Value Date   TESTOFREE 17.8 09/29/2015   TESTOSTERONE 866 09/29/2015     Endocrine Lab Results  Component Value Date   GLUCOSE 99 04/27/2020   TSH 2.720 10/31/2017   TESTOFREE 17.8 09/29/2015   TESTOSTERONE 866 09/29/2015   SHBG 53.6 09/29/2015     Neuropathy No results found for: VITAMINB12, FOLATE, HGBA1C, HIV   CNS No results found for: COLORCSF, APPEARCSF, RBCCOUNTCSF, WBCCSF, POLYSCSF, LYMPHSCSF, EOSCSF, PROTEINCSF, GLUCCSF, JCVIRUS, CSFOLI, IGGCSF, LABACHR, ACETBL, LABACHR, ACETBL   Inflammation (CRP: Acute  ESR: Chronic) No results found for: CRP, ESRSEDRATE, LATICACIDVEN   Rheumatology No results found for: RF, ANA, LABURIC, URICUR, LYMEIGGIGMAB,  LYMEABIGMQN, HLAB27   Coagulation Lab Results  Component Value Date   PLT 300 10/31/2017     Cardiovascular Lab Results  Component Value Date   HGB 15.7 10/31/2017   HCT 45.9 10/31/2017     Screening No results found for: SARSCOV2NAA, COVIDSOURCE, STAPHAUREUS, MRSAPCR, HCVAB, HIV, PREGTESTUR   Cancer No results found for: CEA, CA125, LABCA2   Allergens No results found for: ALMOND, APPLE, ASPARAGUS, AVOCADO, BANANA, BARLEY, BASIL, BAYLEAF, GREENBEAN, LIMABEAN, WHITEBEAN, BEEFIGE, REDBEET, BLUEBERRY, BROCCOLI, CABBAGE, MELON, CARROT, CASEIN, CASHEWNUT, CAULIFLOWER, CELERY     Note: Lab results reviewed.  PFSH  Drug: Mr. Palka  reports no history of drug use. Alcohol:  reports that he does not currently use alcohol. Tobacco:  reports that he has never smoked. He has never used smokeless tobacco. Medical:  has a past medical history of Hypertension. Family: Family history is unknown by patient.  Past Surgical History:  Procedure Laterality Date   APPENDECTOMY  2005   incidental at time of colon surgery.   [place]  COLON SURGERY  2005   Left colectomy for redundant colon with severe constipation.   COLONOSCOPY     COLONOSCOPY WITH PROPOFOL N/A 06/09/2020   Procedure: COLONOSCOPY WITH PROPOFOL;  Surgeon: Robert Bellow, MD;  Location: [facility] ENDOSCOPY;  Service: Endoscopy;  Laterality: N/A;   HERNIA REPAIR  12/16/2003   ventral hernia repair with Compsix Kugel mesh   Active Ambulatory Problems    Diagnosis Date Noted   CN (constipation) 01/02/2014   Onychomycosis 09/12/2015   Gluteal tendinitis, right hip 06/22/2020   Primary osteoarthritis of right hip 07/06/2020   Sacroiliac joint dysfunction of left side 10/04/2020   Back pain of lumbosacral region with sciatica 10/04/2020   Greater trochanteric bursitis, right 10/04/2020   Piriformis syndrome, left 10/05/2020   History of lumbar laminectomy 10/25/2020   Chronic radicular lumbar pain 10/25/2020    Neuropathic pain 10/25/2020   Lumbar facet arthropathy 10/25/2020   Chronic pain syndrome 10/25/2020   Resolved Ambulatory Problems    Diagnosis Date Noted   No Resolved Ambulatory Problems   Past Medical History:  Diagnosis Date   Hypertension    Constitutional Exam  General appearance: Well nourished, well developed, and well hydrated. In no apparent acute distress Vitals:   10/25/20 1258  BP: (!) 139/104  Pulse: 81  Resp: 16  Temp: (!) 96.7 F (35.9 C)  TempSrc: Temporal  SpO2: 100%  Weight: 220 lb (99.8 kg)  Height: _0  (1.88 m)   BMI Assessment: Estimated body mass index is 28.25 kg/m as calculated from the following:   Height as of this encounter: _1  (1.88 m).   Weight as of this encounter: 220 lb (99.8 kg).  BMI interpretation table: BMI level Category Range association with higher incidence of chronic pain  <18 kg/m2 Underweight   18.5-24.9 kg/m2 Ideal body weight   25-29.9 kg/m2 Overweight Increased incidence by 20%  30-34.9 kg/m2 Obese (Class I) Increased incidence by 68%  35-39.9 kg/m2 Severe obesity (Class II) Increased incidence by 136%  >40 kg/m2 Extreme obesity (Class III) Increased incidence by 254%   Patient's current BMI Ideal Body weight  Body mass index is 28.25 kg/m. Ideal body weight: 82.2 kg (181 lb 3.5 oz) Adjusted ideal body weight: 89.2 kg (196 lb 11.7 oz)   BMI Readings from Last 4 Encounters:  10/25/20 28.25 kg/m  10/05/20 29.40 kg/m  10/04/20 29.40 kg/m  09/14/20 30.56 kg/m   Wt Readings from Last 4 Encounters:  10/25/20 220 lb (99.8 kg)  10/05/20 229 lb (103.9 kg)  10/04/20 229 lb (103.9 kg)  09/14/20 238 lb (108 kg)    Psych/Mental status: Alert, oriented x 3 (person, place, & time)       Eyes: PERLA Respiratory: No evidence of acute respiratory distress  Lumbar Spine Area Exam  Skin & Axial Inspection: No masses, redness, or swelling Alignment: Symmetrical Functional ROM: Unrestricted ROM       Stability: No  instability detected Muscle Tone/Strength: Functionally intact. No obvious neuro-muscular anomalies detected. Sensory (Neurological): Dermatomal pain pattern and MSK  Gait & Posture Assessment  Ambulation: Unassisted Gait: Relatively normal for age and body habitus Posture: WNL  Lower Extremity Exam    Side: Right lower extremity  Side: Left lower extremity  Stability: No instability observed          Stability: No instability observed          Skin & Extremity Inspection: Skin color, temperature, and hair growth are WNL. No peripheral edema or cyanosis. No  masses, redness, swelling, asymmetry, or associated skin lesions. No contractures.  Skin & Extremity Inspection: Skin color, temperature, and hair growth are WNL. No peripheral edema or cyanosis. No masses, redness, swelling, asymmetry, or associated skin lesions. No contractures.  Functional ROM: Unrestricted ROM                  Functional ROM: Unrestricted ROM                  Muscle Tone/Strength: Functionally intact. No obvious neuro-muscular anomalies detected.  Muscle Tone/Strength: Functionally intact. No obvious neuro-muscular anomalies detected.  Sensory (Neurological): Unimpaired        Sensory (Neurological): Unimpaired        DTR: Patellar: deferred today Achilles: deferred today Plantar: deferred today  DTR: Patellar: deferred today Achilles: deferred today Plantar: deferred today  Palpation: No palpable anomalies  Palpation: No palpable anomalies   5 out of 5 strength bilateral lower extremity: Plantar flexion, dorsiflexion, knee flexion, knee extension.   Assessment  Primary Diagnosis & Pertinent Problem List: The primary encounter diagnosis was History of lumbar laminectomy (left). Diagnoses of Chronic radicular lumbar pain (left), Neuropathic pain, Lumbar facet arthropathy, and Chronic pain syndrome were also pertinent to this visit.  Visit Diagnosis (New problems to examiner): 1. History of lumbar laminectomy  (left)   2. Chronic radicular lumbar pain (left)   3. Neuropathic pain   4. Lumbar facet arthropathy   5. Chronic pain syndrome    Plan of Care (Initial workup plan)  Note: Mr. Holtzer was reminded that as per protocol, today's visit has been an evaluation only. We have not taken over the patient's controlled substance management.  General Recommendations: The pain condition that the patient suffers from is best treated with a multidisciplinary approach that involves an increase in physical activity to prevent de-conditioning and worsening of the pain cycle, as well as psychological counseling (formal and/or informal) to address the co-morbid psychological affects of pain. Treatment will often involve judicious use of pain medications and interventional procedures to decrease the pain, allowing the patient to participate in the physical activity that will ultimately produce long-lasting pain reductions. The goal of the multidisciplinary approach is to return the patient to a higher level of overall function and to restore their ability to perform activities of daily living.  1. History of lumbar laminectomy (left) - Ambulatory referral to Physical Therapy  2. Chronic radicular lumbar pain (left) - Ambulatory referral to Physical Therapy - Lumbar Epidural Injection; Future  3. Neuropathic pain - Ambulatory referral to Physical Therapy - gabapentin (NEURONTIN) 300 MG capsule; 300 mg qAM, 300 mg qPM, 600 mg QHS  Dispense: 90 capsule; Refill: 2  4. Lumbar facet arthropathy - Ambulatory referral to Physical Therapy  5. Chronic pain syndrome - Ambulatory referral to Physical Therapy - Lumbar Epidural Injection; Future - gabapentin (NEURONTIN) 300 MG capsule; 300 mg qAM, 300 mg qPM, 600 mg QHS  Dispense: 90 capsule; Refill: 2    Referral Orders         Ambulatory referral to Physical Therapy     Procedure Orders         Lumbar Epidural Injection     Pharmacotherapy  (current): Medications ordered:  Meds ordered this encounter  Medications   gabapentin (NEURONTIN) 300 MG capsule    Sig: 300 mg qAM, 300 mg qPM, 600 mg QHS    Dispense:  90 capsule    Refill:  2   Medications administered during this visit: Pieter Partridge  A. Sobek had no medications administered during this visit.   Pharmacological management options:  Opioid Analgesics: The patient was informed that there is no guarantee that he would be a candidate for opioid analgesics. The decision will be made following CDC guidelines. This decision will be based on the results of diagnostic studies, as well as Mr. Stambaugh risk profile.   Membrane stabilizer: Adequate regimen  Muscle relaxant: Adequate regimen  NSAID: To be determined at a later time  Other analgesic(s): To be determined at a later time   Interventional management options: Mr. Fore was informed that there is no guarantee that he would be a candidate for interventional therapies. The decision will be based on the results of diagnostic studies, as well as Mr. Nauta risk profile.  Procedure(s) under consideration:  Lumbar epidural steroid injection Bilateral diagnostic lumbar facet interventional blocks Spinal cord stim trial   Provider-requested follow-up: Return in about 9 days (around 11/03/2020) for L-ESI , without sedation.  Future Appointments  Date Time Provider [place]  05/03/2021  9:40 AM Juline Patch, MD [facility]   I spent a total of 60 minutes reviewing chart data, face-to-face evaluation with the patient, counseling and coordination of care as detailed above.   Note by: Gillis Santa, MD Date: 10/25/2020; Time: 2:37 PM

## 2020-10-25 NOTE — Progress Notes (Signed)
Safety precautions to be maintained throughout the outpatient stay will include: orient to surroundings, keep bed in low position, maintain call bell within reach at all times, provide assistance with transfer out of bed and ambulation.  

## 2020-10-26 ENCOUNTER — Encounter: Payer: Self-pay | Admitting: Student in an Organized Health Care Education/Training Program

## 2020-10-27 ENCOUNTER — Other Ambulatory Visit: Payer: Self-pay | Admitting: Neurosurgery

## 2020-10-27 ENCOUNTER — Other Ambulatory Visit: Payer: Self-pay | Admitting: Orthopedic Surgery

## 2020-10-27 DIAGNOSIS — M545 Low back pain, unspecified: Secondary | ICD-10-CM

## 2020-10-27 DIAGNOSIS — G8929 Other chronic pain: Secondary | ICD-10-CM

## 2020-10-28 ENCOUNTER — Ambulatory Visit
Payer: No Typology Code available for payment source | Admitting: Student in an Organized Health Care Education/Training Program

## 2020-10-28 ENCOUNTER — Ambulatory Visit: Payer: No Typology Code available for payment source | Admitting: Family Medicine

## 2020-10-29 ENCOUNTER — Telehealth: Payer: Self-pay

## 2020-10-29 NOTE — Telephone Encounter (Signed)
[name], Cody A to [employer] [facility] (supporting Edward Jolly, MD)     6:16 PM Dr Cherylann Ratel could you give me a call as soon as you can. Is a CT guided injection something you can do? You're thoughts?

## 2020-11-09 ENCOUNTER — Encounter: Payer: Self-pay | Admitting: *Deleted

## 2020-11-17 ENCOUNTER — Other Ambulatory Visit: Payer: Self-pay

## 2020-11-17 ENCOUNTER — Ambulatory Visit (HOSPITAL_BASED_OUTPATIENT_CLINIC_OR_DEPARTMENT_OTHER)
Payer: No Typology Code available for payment source | Admitting: Student in an Organized Health Care Education/Training Program

## 2020-11-17 ENCOUNTER — Encounter: Payer: Self-pay | Admitting: Student in an Organized Health Care Education/Training Program

## 2020-11-17 ENCOUNTER — Ambulatory Visit
Admission: RE | Admit: 2020-11-17 | Discharge: 2020-11-17 | Disposition: A | Discharge status: Home / Self Care | Payer: No Typology Code available for payment source | Source: Ambulatory Visit | Attending: Student in an Organized Health Care Education/Training Program | Admitting: Student in an Organized Health Care Education/Training Program

## 2020-11-17 DIAGNOSIS — G894 Chronic pain syndrome: Secondary | ICD-10-CM

## 2020-11-17 DIAGNOSIS — G8929 Other chronic pain: Secondary | ICD-10-CM | POA: Diagnosis present

## 2020-11-17 DIAGNOSIS — M5416 Radiculopathy, lumbar region: Secondary | ICD-10-CM | POA: Diagnosis present

## 2020-11-17 MED ORDER — DEXAMETHASONE SODIUM PHOSPHATE 10 MG/ML IJ SOLN
INTRAMUSCULAR | Status: AC
  Filled 2020-11-17: qty 1

## 2020-11-17 MED ORDER — ROPIVACAINE HCL 2 MG/ML IJ SOLN
2.0000 mL | Freq: Once | INTRAMUSCULAR | Status: AC
  Administered 2020-11-17: 2 mL via EPIDURAL

## 2020-11-17 MED ORDER — ROPIVACAINE HCL 2 MG/ML IJ SOLN
INTRAMUSCULAR | Status: AC
  Filled 2020-11-17: qty 20

## 2020-11-17 MED ORDER — DEXAMETHASONE SODIUM PHOSPHATE 10 MG/ML IJ SOLN
10.0000 mg | Freq: Once | INTRAMUSCULAR | Status: AC
  Administered 2020-11-17: 10 mg

## 2020-11-17 MED ORDER — LIDOCAINE HCL 2 % IJ SOLN
20.0000 mL | Freq: Once | INTRAMUSCULAR | Status: AC
  Administered 2020-11-17: 200 mg
  Filled 2020-11-17: qty 20

## 2020-11-17 MED ORDER — IOHEXOL 180 MG/ML  SOLN
10.0000 mL | Freq: Once | INTRAMUSCULAR | Status: AC
  Administered 2020-11-17: 5 mL via EPIDURAL

## 2020-11-17 MED ORDER — SODIUM CHLORIDE 0.9% FLUSH
2.0000 mL | Freq: Once | INTRAVENOUS | Status: AC
  Administered 2020-11-17: 2 mL

## 2020-11-17 MED ORDER — LIDOCAINE HCL (PF) 2 % IJ SOLN
INTRAMUSCULAR | Status: AC
  Filled 2020-11-17: qty 10

## 2020-11-17 MED ORDER — DIAZEPAM 5 MG PO TABS
ORAL_TABLET | ORAL | Status: AC
  Filled 2020-11-17: qty 1

## 2020-11-17 MED ORDER — SODIUM CHLORIDE (PF) 0.9 % IJ SOLN
INTRAMUSCULAR | Status: AC
  Filled 2020-11-17: qty 10

## 2020-11-17 NOTE — Progress Notes (Signed)
PROVIDER NOTE: Information contained herein reflects review and annotations entered in association with encounter. Interpretation of such information and data should be left to medically-trained personnel. Information provided to patient can be located elsewhere in the medical record under "Patient Instructions". Document created using STT-dictation technology, any transcriptional errors that may result from process are unintentional.    Patient: Cody [name]  Service Category: Procedure  Provider: Edward Jolly, MD  DOB: 07-05-1970  DOS: 11/17/2020  Location: [facility]  MRN: [id number]  Setting: Ambulatory - outpatient  Referring Provider: Edward Jolly, MD  Type: Established Patient  Specialty: Interventional Pain Management  PCP: Duanne Limerick, MD   Primary Reason for Visit: Interventional Pain Management Treatment. CC: Back Pain (lower)   Procedure:          Anesthesia, Analgesia, Anxiolysis:  Type: Therapeutic Inter-Laminar Epidural Steroid Injection           Region: Lumbar Level: L4-5 Level. Laterality: Left-Sided         Type: Local Anesthesia + 5 MG PO Valium Local Anesthetic: Lidocaine 1-2% Sedation: Minimal Anxiolysis  Indication(s): Anxiety & Analgesia    Position: Prone with head of the table was raised to facilitate breathing.   Indications: 1. Chronic radicular lumbar pain (left)   2. Chronic pain syndrome    Pain Score: Pre-procedure: 4 /10 Post-procedure: 4 /10    Pre-op H&P Assessment:  Mr. Orlick is a 50 y.o. (year old), male patient, seen today for interventional treatment. He  has a past surgical history that includes Back surgery (2000); Colonoscopy; Colon surgery (2005); Appendectomy (2005); Hernia repair (12/16/2003); and Colonoscopy with propofol (N/A, 06/09/2020). Mr. Buday has a current medication list which includes the following prescription(s): celecoxib, gabapentin, and cyclobenzaprine. His primarily concern today is the Back Pain  (lower)  Initial Vital Signs:  Pulse/HCG Rate: 81ECG Heart Rate: 76 Temp:  (!) 97.3 F (36.3 C) Resp: 16 BP:  (!) 132/95 SpO2: 96 %  BMI: Estimated body mass index is 28.25 kg/m as calculated from the following:   Height as of this encounter: 6\' 2"  (1.88 m).   Weight as of this encounter: 220 lb (99.8 kg).  Risk Assessment: Allergies: Reviewed. He has No Known Allergies.  Allergy Precautions: None required Coagulopathies: Reviewed. None identified.  Blood-thinner therapy: None at this time Active Infection(s): Reviewed. None identified. Mr. Kinzler is afebrile  Site Confirmation: Mr. Fukuda was asked to confirm the procedure and laterality before marking the site Procedure checklist: Completed Consent: Before the procedure and under the influence of no sedative(s), amnesic(s), or anxiolytics, the patient was informed of the treatment options, risks and possible complications. To fulfill our ethical and legal obligations, as recommended by the American Medical Association's Code of Ethics, I have informed the patient of my clinical impression; the nature and purpose of the treatment or procedure; the risks, benefits, and possible complications of the intervention; the alternatives, including doing nothing; the risk(s) and benefit(s) of the alternative treatment(s) or procedure(s); and the risk(s) and benefit(s) of doing nothing. The patient was provided information about the general risks and possible complications associated with the procedure. These may include, but are not limited to: failure to achieve desired goals, infection, bleeding, organ or nerve damage, allergic reactions, paralysis, and death. In addition, the patient was informed of those risks and complications associated to Spine-related procedures, such as failure to decrease pain; infection (i.e.: Meningitis, epidural or intraspinal abscess); bleeding (i.e.: epidural hematoma, subarachnoid hemorrhage, or any other type of  intraspinal or peri-dural bleeding); organ or nerve damage (i.e.: Any type of peripheral nerve, nerve root, or spinal cord injury) with subsequent damage to sensory, motor, and/or autonomic systems, resulting in permanent pain, numbness, and/or weakness of one or several areas of the body; allergic reactions; (i.e.: anaphylactic reaction); and/or death. Furthermore, the patient was informed of those risks and complications associated with the medications. These include, but are not limited to: allergic reactions (i.e.: anaphylactic or anaphylactoid reaction(s)); adrenal axis suppression; blood sugar elevation that in diabetics may result in ketoacidosis or comma; water retention that in patients with history of congestive heart failure may result in shortness of breath, pulmonary edema, and decompensation with resultant heart failure; weight gain; swelling or edema; medication-induced neural toxicity; particulate matter embolism and blood vessel occlusion with resultant organ, and/or nervous system infarction; and/or aseptic necrosis of one or more joints. Finally, the patient was informed that Medicine is not an exact science; therefore, there is also the possibility of unforeseen or unpredictable risks and/or possible complications that may result in a catastrophic outcome. The patient indicated having understood very clearly. We have given the patient no guarantees and we have made no promises. Enough time was given to the patient to ask questions, all of which were answered to the patient's satisfaction. Mr. Slinger has indicated that he wanted to continue with the procedure. Attestation: I, the ordering provider, attest that I have discussed with the patient the benefits, risks, side-effects, alternatives, likelihood of achieving goals, and potential problems during recovery for the procedure that I have provided informed consent. Date  Time: 11/17/2020 11:39 AM  Pre-Procedure Preparation:  Monitoring: As  per clinic protocol. Respiration, ETCO2, SpO2, BP, heart rate and rhythm monitor placed and checked for adequate function Safety Precautions: Patient was assessed for positional comfort and pressure points before starting the procedure. Time-out: I initiated and conducted the "Time-out" before starting the procedure, as per protocol. The patient was asked to participate by confirming the accuracy of the "Time Out" information. Verification of the correct person, site, and procedure were performed and confirmed by me, the nursing staff, and the patient. "Time-out" conducted as per Joint Commission's Universal Protocol (UP.01.01.01). Time: 1212  Description of Procedure:          Target Area: The interlaminar space, initially targeting the lower laminar border of the superior vertebral body. Approach: Paramedial approach. Area Prepped: Entire Posterior Lumbar Region DuraPrep (Iodine Povacrylex [0.7% available iodine] and Isopropyl Alcohol, 74% w/w) Safety Precautions: Aspiration looking for blood return was conducted prior to all injections. At no point did we inject any substances, as a needle was being advanced. No attempts were made at seeking any paresthesias. Safe injection practices and needle disposal techniques used. Medications properly checked for expiration dates. SDV (single dose vial) medications used. Description of the Procedure: Protocol guidelines were followed. The procedure needle was introduced through the skin, ipsilateral to the reported pain, and advanced to the target area. Bone was contacted and the needle walked caudad, until the lamina was cleared. The epidural space was identified using "loss-of-resistance technique" with 2-3 ml of PF-NaCl (0.9% NSS), in a 5cc LOR glass syringe.  Vitals:   11/17/20 1211 11/17/20 1215 11/17/20 1217 11/17/20 1224  BP: (!) 123/100 (!) 138/104 (!) 137/115 (!) 140/117  Pulse:      Resp: 15 16 18    Temp:      TempSrc:      SpO2: 94% 93% 92%    Weight:  Height:        Start Time: 1212 hrs. End Time: 1217 hrs.  Materials:  Needle(s) Type: Epidural needle Gauge: 22G Length: 3.5-in Medication(s): Please see orders for medications and dosing details. 6 cc solution made of 3cc of preservative-free saline, 2 cc of 0.2% ropivacaine, 1 cc of Decadron 10 mg/cc.  Imaging Guidance (Spinal):          Type of Imaging Technique: Fluoroscopy Guidance (Spinal) Indication(s): Assistance in needle guidance and placement for procedures requiring needle placement in or near specific anatomical locations not easily accessible without such assistance. Exposure Time: Please see nurses notes. Contrast: Before injecting any contrast, we confirmed that the patient did not have an allergy to iodine, shellfish, or radiological contrast. Once satisfactory needle placement was completed at the desired level, radiological contrast was injected. Contrast injected under live fluoroscopy. No contrast complications. See chart for type and volume of contrast used. Fluoroscopic Guidance: I was personally present during the use of fluoroscopy. "Tunnel Vision Technique" used to obtain the best possible view of the target area. Parallax error corrected before commencing the procedure. "Direction-depth-direction" technique used to introduce the needle under continuous pulsed fluoroscopy. Once target was reached, antero-posterior, oblique, and lateral fluoroscopic projection used confirm needle placement in all planes. Images permanently stored in EMR. Interpretation: I personally interpreted the imaging intraoperatively. Adequate needle placement confirmed in multiple planes. Appropriate spread of contrast into desired area was observed. No evidence of afferent or efferent intravascular uptake. No intrathecal or subarachnoid spread observed. Permanent images saved into the patient's record.   Post-operative Assessment:  Post-procedure Vital Signs:  Pulse/HCG Rate:  8178 Temp:  (!) 97.3 F (36.3 C) Resp: 18 BP:  (!) 140/117 (MD notified.  patient sitting up and changed to large cuff.) SpO2: 92 %  EBL: None  Complications: No immediate post-treatment complications observed by team, or reported by patient.  Note: The patient tolerated the entire procedure well. A repeat set of vitals were taken after the procedure and the patient was kept under observation following institutional policy, for this type of procedure. Post-procedural neurological assessment was performed, showing return to baseline, prior to discharge. The patient was provided with post-procedure discharge instructions, including a section on how to identify potential problems. Should any problems arise concerning this procedure, the patient was given instructions to immediately contact us, at any time, without hesitation. In any case, we plan to contact the patient by telephone for a follow-up status report regarding this interventional procedure.  Comments:  No additional relevant information.  Plan of Care  Orders:  Orders Placed This Encounter  Procedures   DG PAIN CLINIC C-ARM 1-60 MIN NO REPORT    Intraoperative interpretation by procedural physician at [facility].    Standing Status:   Standing    Number of Occurrences:   1    Order Specific Question:   Reason for exam:    Answer:   Assistance in needle guidance and placement for procedures requiring needle placement in or near specific anatomical locations not easily accessible without such assistance.    Medications ordered for procedure: Meds ordered this encounter  Medications   iohexol (OMNIPAQUE) 180 MG/ML injection 10 mL    Must be Myelogram-compatible. If not available, you may substitute with a water-soluble, non-ionic, hypoallergenic, myelogram-compatible radiological contrast medium.   lidocaine (XYLOCAINE) 2 % (with pres) injection 400 mg   ropivacaine (PF) 2 mg/mL (0.2%) (NAROPIN) injection 2 mL    sodium chloride flush (NS) 0.9 %  injection 2 mL   dexamethasone (DECADRON) injection 10 mg   Medications administered: We administered iohexol, lidocaine, ropivacaine (PF) 2 mg/mL (0.2%), sodium chloride flush, and dexamethasone.  See the medical record for exact dosing, route, and time of administration.  Follow-up plan:   Return in about 5 weeks (around 12/22/2020) for Post Procedure Evaluation, virtual.      Left L4/5 ESI #1 11/17/20   Recent Visits Date Type Provider Dept  10/25/20 Office Visit Edward Jolly, MD [facility]  Showing recent visits within past 90 days and meeting all other requirements Today's Visits Date Type Provider Dept  11/17/20 Procedure visit Edward Jolly, MD [facility]  Showing today's visits and meeting all other requirements Future Appointments Date Type Provider Dept  12/22/20 Appointment Edward Jolly, MD [facility]  Showing future appointments within next 90 days and meeting all other requirements Disposition: Discharge home  Discharge (Date  Time): 11/17/2020; 1228 hrs.   Primary Care Physician: Duanne Limerick, MD Location: [facility] Note by: Edward Jolly, MD Date: 11/17/2020; Time: 12:52 PM  Disclaimer:  Medicine is not an exact science. The only guarantee in medicine is that nothing is guaranteed. It is important to note that the decision to proceed with this intervention was based on the information collected from the patient. The Data and conclusions were drawn from the patient's questionnaire, the interview, and the physical examination. Because the information was provided in large part by the patient, it cannot be guaranteed that it has not been purposely or unconsciously manipulated. Every effort has been made to obtain as much relevant data as possible for this evaluation. It is important to note that the conclusions that lead to this procedure are derived in large part from the  available data. Always take into account that the treatment will also be dependent on availability of resources and existing treatment guidelines, considered by other Pain Management Practitioners as being common knowledge and practice, at the time of the intervention. For Medico-Legal purposes, it is also important to point out that variation in procedural techniques and pharmacological choices are the acceptable norm. The indications, contraindications, technique, and results of the above procedure should only be interpreted and judged by a Board-Certified Interventional Pain Specialist with extensive familiarity and expertise in the same exact procedure and technique.

## 2020-11-17 NOTE — Progress Notes (Signed)
Charls had some elevated BP's  asymptomatic.  does have BP medicine but does not take it.  counseled on the importance and encouraged to keep check on this or contact PCP.    BL notified.

## 2020-11-17 NOTE — Progress Notes (Signed)
Safety precautions to be maintained throughout the outpatient stay will include: orient to surroundings, keep bed in low position, maintain call bell within reach at all times, provide assistance with transfer out of bed and ambulation.  

## 2020-11-17 NOTE — Patient Instructions (Signed)

## 2020-11-18 ENCOUNTER — Telehealth: Payer: Self-pay

## 2020-11-18 NOTE — Telephone Encounter (Signed)
Post procedure phone call.  LM 

## 2020-11-22 ENCOUNTER — Other Ambulatory Visit: Payer: Self-pay | Admitting: Family Medicine

## 2020-11-22 ENCOUNTER — Other Ambulatory Visit: Payer: Self-pay

## 2020-11-22 MED ORDER — SILDENAFIL CITRATE 100 MG PO TABS
ORAL_TABLET | ORAL | 0 refills | Status: DC
  Filled 2020-11-22: qty 6, 30d supply, fill #0
  Filled 2021-01-21: qty 4, 25d supply, fill #1

## 2020-11-22 MED FILL — Cyclobenzaprine HCl Tab 10 MG: ORAL | 15 days supply | Qty: 15 | Fill #0 | Status: AC

## 2020-11-23 ENCOUNTER — Other Ambulatory Visit: Payer: Self-pay

## 2020-12-22 ENCOUNTER — Other Ambulatory Visit: Payer: Self-pay

## 2020-12-22 ENCOUNTER — Ambulatory Visit
Payer: No Typology Code available for payment source | Attending: Student in an Organized Health Care Education/Training Program | Admitting: Student in an Organized Health Care Education/Training Program

## 2020-12-22 DIAGNOSIS — M5416 Radiculopathy, lumbar region: Secondary | ICD-10-CM

## 2020-12-22 DIAGNOSIS — G8929 Other chronic pain: Secondary | ICD-10-CM

## 2020-12-22 NOTE — Progress Notes (Addendum)
I attempted to call the patient however no response.  -Dr Alyson Ki  

## 2021-01-21 ENCOUNTER — Other Ambulatory Visit: Payer: Self-pay

## 2021-02-25 ENCOUNTER — Other Ambulatory Visit: Payer: Self-pay | Admitting: Family Medicine

## 2021-02-25 ENCOUNTER — Other Ambulatory Visit: Payer: Self-pay

## 2021-02-25 NOTE — Telephone Encounter (Signed)
Requested medication (s) are due for refill today:   Yes  Requested medication (s) are on the active medication list:   Yes  Future visit scheduled:   Yes 05/03/2021   Last ordered: 11/22/2020 #10, 0 refills  Returned because provider needs to review for refills prior to appt.     Requested Prescriptions  Pending Prescriptions Disp Refills   sildenafil (VIAGRA) 100 MG tablet 10 tablet 0    Sig: TAKE 1 TABLET (100 MG TOTAL) BY MOUTH DAILY AS NEEDED FOR ERECTILE DYSFUNCTION.     Urology: Erectile Dysfunction Agents Failed - 02/25/2021  1:28 PM      Failed - Last BP in normal range    BP Readings from Last 1 Encounters:  11/17/20 (!) 140/117          Passed - Valid encounter within last 12 months    Recent Outpatient Visits           4 months ago Back pain of lumbosacral region with sciatica   [facility] Jerrol Banana, MD   4 months ago Sacroiliac joint dysfunction of left side   [facility] Jerrol Banana, MD   5 months ago Primary osteoarthritis of right hip   [facility] Jerrol Banana, MD   7 months ago Primary osteoarthritis of right hip   [facility] Jerrol Banana, MD   8 months ago Gluteal tendinitis, right hip   [facility] Jerrol Banana, MD       Future Appointments             In 2 months Duanne Limerick, MD [facility], [facility]

## 2021-03-01 ENCOUNTER — Other Ambulatory Visit: Payer: Self-pay

## 2021-03-04 ENCOUNTER — Other Ambulatory Visit: Payer: Self-pay | Admitting: Family Medicine

## 2021-03-04 ENCOUNTER — Other Ambulatory Visit: Payer: Self-pay

## 2021-03-04 MED ORDER — SILDENAFIL CITRATE 100 MG PO TABS
ORAL_TABLET | ORAL | 0 refills | Status: DC
  Filled 2021-03-04: qty 6, 30d supply, fill #0
  Filled 2021-04-08: qty 6, 30d supply, fill #1
  Filled 2021-05-25: qty 6, 30d supply, fill #2
  Filled 2021-07-15: qty 6, 30d supply, fill #3
  Filled 2021-08-18 – 2021-08-22 (×2): qty 6, 30d supply, fill #4

## 2021-03-04 NOTE — Telephone Encounter (Signed)
Requested Prescriptions  Pending Prescriptions Disp Refills   sildenafil (VIAGRA) 100 MG tablet 10 tablet 0    Sig: TAKE 1 TABLET (100 MG TOTAL) BY MOUTH DAILY AS NEEDED FOR ERECTILE DYSFUNCTION.     Urology: Erectile Dysfunction Agents Failed - 03/04/2021  9:23 AM      Failed - Last BP in normal range    BP Readings from Last 1 Encounters:  11/17/20 (!) 140/117         Passed - Valid encounter within last 12 months    Recent Outpatient Visits          5 months ago Back pain of lumbosacral region with sciatica   [facility] Montel Culver, MD   5 months ago Sacroiliac joint dysfunction of left side   [facility] Montel Culver, MD   5 months ago Primary osteoarthritis of right hip   [facility] Montel Culver, MD   8 months ago Primary osteoarthritis of right hip   [facility] Montel Culver, MD   8 months ago Gluteal tendinitis, right hip   [facility] Montel Culver, MD      Future Appointments            In 2 months Juline Patch, MD [facility], [facility]

## 2021-04-08 ENCOUNTER — Other Ambulatory Visit: Payer: Self-pay

## 2021-04-25 ENCOUNTER — Encounter: Payer: Self-pay | Admitting: Student in an Organized Health Care Education/Training Program

## 2021-04-27 ENCOUNTER — Encounter: Payer: Self-pay | Admitting: Family Medicine

## 2021-04-27 NOTE — Telephone Encounter (Signed)
Please review.  KP

## 2021-05-03 ENCOUNTER — Other Ambulatory Visit: Payer: Self-pay

## 2021-05-03 ENCOUNTER — Encounter: Payer: Self-pay | Admitting: Family Medicine

## 2021-05-03 ENCOUNTER — Ambulatory Visit (INDEPENDENT_AMBULATORY_CARE_PROVIDER_SITE_OTHER): Payer: No Typology Code available for payment source | Admitting: Family Medicine

## 2021-05-03 VITALS — BP 138/100 | HR 83 | Ht 74.0 in | Wt 239.0 lb

## 2021-05-03 DIAGNOSIS — Z0000 Encounter for general adult medical examination without abnormal findings: Secondary | ICD-10-CM | POA: Diagnosis not present

## 2021-05-03 DIAGNOSIS — I10 Essential (primary) hypertension: Secondary | ICD-10-CM | POA: Diagnosis not present

## 2021-05-03 LAB — HEMOCCULT GUIAC POC 1CARD (OFFICE): Fecal Occult Blood, POC: NEGATIVE

## 2021-05-03 MED ORDER — LISINOPRIL 5 MG PO TABS
5.0000 mg | ORAL_TABLET | Freq: Every day | ORAL | 0 refills | Status: DC
  Filled 2021-05-03: qty 60, 60d supply, fill #0

## 2021-05-03 NOTE — Progress Notes (Signed)
Date:  05/03/2021   Name:  Cody [name]   DOB:  1970/08/30   MRN:  [id number]   Chief Complaint: Annual Exam  Patient is a 51 year old male who presents for a comprehensive physical exam. The patient reports the following problems: elevated blood pressure. Health maintenance has been reviewed up to date.     Lab Results  Component Value Date   NA 141 04/27/2020   K 4.7 04/27/2020   CO2 21 04/27/2020   GLUCOSE 99 04/27/2020   BUN 15 04/27/2020   CREATININE 1.16 04/27/2020   CALCIUM 9.5 04/27/2020   EGFR 76 04/27/2020   GFRNONAA 63 10/31/2017   Lab Results  Component Value Date   CHOL 263 (H) 04/27/2020   HDL 47 04/27/2020   LDLCALC 201 (H) 04/27/2020   TRIG 86 04/27/2020   CHOLHDL 3.7 05/18/2017   Lab Results  Component Value Date   TSH 2.720 10/31/2017   No results found for: HGBA1C Lab Results  Component Value Date   WBC 5.2 10/31/2017   HGB 15.7 10/31/2017   HCT 45.9 10/31/2017   MCV 87 10/31/2017   PLT 300 10/31/2017   Lab Results  Component Value Date   ALT 24 09/15/2015   AST 26 09/15/2015   ALKPHOS 46 09/15/2015   BILITOT 0.8 09/15/2015   No results found for: 25OHVITD2, 25OHVITD3, VD25OH   Review of Systems  Constitutional:  Negative for chills and fever.  HENT:  Negative for drooling, ear discharge, ear pain and sore throat.   Respiratory:  Negative for cough, shortness of breath and wheezing.   Cardiovascular:  Negative for chest pain, palpitations and leg swelling.  Gastrointestinal:  Negative for abdominal pain, blood in stool, constipation, diarrhea and nausea.  Endocrine: Negative for polydipsia.  Genitourinary:  Negative for dysuria, frequency, hematuria and urgency.  Musculoskeletal:  Negative for back pain, myalgias and neck pain.  Skin:  Negative for rash.  Allergic/Immunologic: Negative for environmental allergies.  Neurological:  Negative for dizziness and headaches.  Hematological:  Does not bruise/bleed easily.   Psychiatric/Behavioral:  Negative for suicidal ideas. The patient is not nervous/anxious.    Patient Active Problem List   Diagnosis Date Noted   History of lumbar laminectomy 10/25/2020   Chronic radicular lumbar pain 10/25/2020   Neuropathic pain 10/25/2020   Lumbar facet arthropathy 10/25/2020   Chronic pain syndrome 10/25/2020   Piriformis syndrome, left 10/05/2020   Sacroiliac joint dysfunction of left side 10/04/2020   Back pain of lumbosacral region with sciatica 10/04/2020   Greater trochanteric bursitis, right 10/04/2020   Primary osteoarthritis of right hip 07/06/2020   Gluteal tendinitis, right hip 06/22/2020   Onychomycosis 09/12/2015   CN (constipation) 01/02/2014    No Known Allergies  Past Surgical History:  Procedure Laterality Date   APPENDECTOMY  2005   incidental at time of colon surgery.   BACK SURGERY  2000   COLON SURGERY  2005   Left colectomy for redundant colon with severe constipation.   COLONOSCOPY     COLONOSCOPY WITH PROPOFOL N/A 06/09/2020   Procedure: COLONOSCOPY WITH PROPOFOL;  Surgeon: Robert Bellow, MD;  Location: [facility] ENDOSCOPY;  Service: Endoscopy;  Laterality: N/A;   HERNIA REPAIR  12/16/2003   ventral hernia repair with Compsix Kugel mesh    Social History   Tobacco Use   Smoking status: Never   Smokeless tobacco: Never  Vaping Use   Vaping Use: Never used  Substance Use Topics   Alcohol  use: Not Currently    Alcohol/week: 0.0 standard drinks   Drug use: Never     Medication list has been reviewed and updated.  Current Meds  Medication Sig   sildenafil (VIAGRA) 100 MG tablet TAKE 1 TABLET (100 MG TOTAL) BY MOUTH DAILY AS NEEDED FOR ERECTILE DYSFUNCTION.    PHQ 2/9 Scores 05/03/2021 10/25/2020 10/05/2020 10/04/2020  PHQ - 2 Score 0 0 0 0  PHQ- 9 Score 2 - 0 5    GAD 7 : Generalized Anxiety Score 05/03/2021 10/05/2020 10/04/2020 09/14/2020  Nervous, Anxious, on Edge 0 0 0 0  Control/stop worrying 0 0 0 0  Worry too much -  different things 0 0 0 0  Trouble relaxing 0 0 0 0  Restless 0 0 0 0  Easily annoyed or irritable 0 0 0 0  Afraid - awful might happen 0 0 0 0  Total GAD 7 Score 0 0 0 0  Anxiety Difficulty Not difficult at all Not difficult at all - Not difficult at all    BP Readings from Last 3 Encounters:  05/03/21 (!) 138/100  11/17/20 (!) 140/117  10/25/20 (!) 139/104    Physical Exam Vitals and nursing note reviewed.  Constitutional:      Appearance: He is well-groomed and overweight.  HENT:     Head: Normocephalic.     Right Ear: Hearing, tympanic membrane, ear canal and external ear normal.     Left Ear: Hearing, tympanic membrane, ear canal and external ear normal.     Nose: Nose normal.     Right Turbinates: Not enlarged.     Left Turbinates: Not enlarged.     Mouth/Throat:     Lips: Pink.     Mouth: Mucous membranes are moist.     Dentition: Normal dentition.     Tongue: No lesions.     Pharynx: Oropharynx is clear. Uvula midline. No pharyngeal swelling, oropharyngeal exudate, posterior oropharyngeal erythema or uvula swelling.  Eyes:     General: Lids are normal. Vision grossly intact. Gaze aligned appropriately. No scleral icterus.       Right eye: No discharge.        Left eye: No discharge.     Extraocular Movements: Extraocular movements intact.     Conjunctiva/sclera: Conjunctivae normal.     Pupils: Pupils are equal, round, and reactive to light.     Funduscopic exam:    Right eye: Red reflex present.        Left eye: Red reflex present. Neck:     Thyroid: No thyroid mass, thyromegaly or thyroid tenderness.     Vascular: Normal carotid pulses. No carotid bruit, hepatojugular reflux or JVD.     Trachea: Trachea and phonation normal. No tracheal deviation.  Cardiovascular:     Rate and Rhythm: Normal rate and regular rhythm.     Chest Wall: PMI is not displaced. No thrill.     Pulses: Normal pulses.          Carotid pulses are 2+ on the right side and 2+ on the left  side.      Radial pulses are 2+ on the right side and 2+ on the left side.       Femoral pulses are 2+ on the right side and 2+ on the left side.      Popliteal pulses are 2+ on the right side and 2+ on the left side.       Dorsalis pedis pulses are 2+ on  the right side and 2+ on the left side.       Posterior tibial pulses are 2+ on the right side and 2+ on the left side.     Heart sounds: Normal heart sounds, S1 normal and S2 normal. No murmur heard. No systolic murmur is present.  No diastolic murmur is present.    No friction rub. No gallop. No S3 or S4 sounds.  Pulmonary:     Effort: Pulmonary effort is normal. No respiratory distress.     Breath sounds: Normal breath sounds and air entry. No decreased breath sounds, wheezing, rhonchi or rales.  Chest:     Chest wall: No mass.  Breasts:    Breasts are symmetrical.     Right: Normal. No mass.     Left: Normal. No mass.  Abdominal:     General: Bowel sounds are normal.     Palpations: Abdomen is soft. There is no hepatomegaly, splenomegaly or mass.     Tenderness: There is no abdominal tenderness. There is no guarding or rebound.  Genitourinary:    Penis: Normal and circumcised.      Testes:        Right: Mass or tenderness not present.        Left: Mass or tenderness not present.     Prostate: Normal. Not enlarged, not tender and no nodules present.     Rectum: Normal. Guaiac result negative. No mass.  Musculoskeletal:        General: No tenderness. Normal range of motion.     Cervical back: Normal range of motion and neck supple.     Right lower leg: No edema.     Left lower leg: No edema.  Lymphadenopathy:     Head:     Right side of head: No submental, submandibular or tonsillar adenopathy.     Left side of head: No submental, submandibular or tonsillar adenopathy.     Cervical: No cervical adenopathy.     Right cervical: No superficial, deep or posterior cervical adenopathy.    Left cervical: No superficial, deep or  posterior cervical adenopathy.     Upper Body:     Right upper body: No supraclavicular or axillary adenopathy.     Left upper body: No supraclavicular or axillary adenopathy.  Skin:    General: Skin is warm.     Capillary Refill: Capillary refill takes less than 2 seconds.     Findings: No rash.  Neurological:     Mental Status: He is alert and oriented to person, place, and time.     Cranial Nerves: Cranial nerves 2-12 are intact. No cranial nerve deficit.     Sensory: Sensation is intact.     Motor: Motor function is intact.     Coordination: Romberg sign negative.     Deep Tendon Reflexes: Reflexes are normal and symmetric.  Psychiatric:        Behavior: Behavior is cooperative.    Wt Readings from Last 3 Encounters:  05/03/21 239 lb (108.4 kg)  11/17/20 220 lb (99.8 kg)  10/25/20 220 lb (99.8 kg)    BP (!) 138/100 (BP Location: Left Arm, Cuff Size: Large)    Pulse 83    Ht 6' 2"  (1.88 m)    Wt 239 lb (108.4 kg)    SpO2 96%    BMI 30.69 kg/m   Assessment and Plan: Patient is a 51year old male who presents for a comprehensive physical exam. The patient reports the following  problems: Elevated blood pressure. Health maintenance has been reviewed up-to-date.Reyes Aldaco Neuroth is a 51 y.o. male who presents today for his Complete Annual Exam. He feels well. He reports exercising . He reports he is sleeping well.   Patient's chart was reviewed prior to exam.  Patient's chart was reviewed as to prior evaluations, most recent labs, most recent imaging, in [place]. 1. Annual physical exam Immunizations are reviewed and recommendations provided.   Age appropriate screening tests are discussed. Counseling given for risk factor reduction interventions.  No subjective/objective concerns noted during history, review of symptoms and physical exam.  We will obtain labs consisting of lipid panel and CMP. - POCT Occult Blood Stool - Lipid Panel With LDL/HDL Ratio - Comprehensive Metabolic  Panel (CMET)  2. Primary hypertension Chronic.  Persistent.  Review of previous readings notes that particular diastolics are always elevated.  Patient has been given Dash diet in addition to initiation of lisinopril 5 mg once a day.  We will recheck blood pressure in 6 weeks. - lisinopril (ZESTRIL) 5 MG tablet; Take 1 tablet (5 mg total) by mouth daily.  Dispense: 60 tablet; Refill: 0

## 2021-05-04 LAB — COMPREHENSIVE METABOLIC PANEL
ALT: 27 IU/L (ref 0–44)
AST: 26 IU/L (ref 0–40)
Albumin/Globulin Ratio: 2.2 (ref 1.2–2.2)
Albumin: 4.7 g/dL (ref 3.8–4.9)
Alkaline Phosphatase: 61 IU/L (ref 44–121)
BUN/Creatinine Ratio: 10 (ref 9–20)
BUN: 13 mg/dL (ref 6–24)
Bilirubin Total: 0.7 mg/dL (ref 0.0–1.2)
CO2: 24 mmol/L (ref 20–29)
Calcium: 9.4 mg/dL (ref 8.7–10.2)
Chloride: 103 mmol/L (ref 96–106)
Creatinine, Ser: 1.3 mg/dL — ABNORMAL HIGH (ref 0.76–1.27)
Globulin, Total: 2.1 g/dL (ref 1.5–4.5)
Glucose: 95 mg/dL (ref 70–99)
Potassium: 4.3 mmol/L (ref 3.5–5.2)
Sodium: 142 mmol/L (ref 134–144)
Total Protein: 6.8 g/dL (ref 6.0–8.5)
eGFR: 66 mL/min/{1.73_m2} (ref >59)

## 2021-05-04 LAB — LIPID PANEL WITH LDL/HDL RATIO
Cholesterol, Total: 273 mg/dL — ABNORMAL HIGH (ref 100–199)
HDL: 45 mg/dL (ref >39)
LDL Chol Calc (NIH): 216 mg/dL — ABNORMAL HIGH (ref 0–99)
LDL/HDL Ratio: 4.8 ratio — ABNORMAL HIGH (ref 0.0–3.6)
Triglycerides: 76 mg/dL (ref 0–149)
VLDL Cholesterol Cal: 12 mg/dL (ref 5–40)

## 2021-05-09 ENCOUNTER — Other Ambulatory Visit: Payer: Self-pay

## 2021-05-09 ENCOUNTER — Ambulatory Visit: Payer: No Typology Code available for payment source | Admitting: Family Medicine

## 2021-05-09 ENCOUNTER — Encounter: Payer: Self-pay | Admitting: Family Medicine

## 2021-05-09 ENCOUNTER — Inpatient Hospital Stay (INDEPENDENT_AMBULATORY_CARE_PROVIDER_SITE_OTHER): Payer: No Typology Code available for payment source | Admitting: Radiology

## 2021-05-09 ENCOUNTER — Ambulatory Visit (INDEPENDENT_AMBULATORY_CARE_PROVIDER_SITE_OTHER): Payer: No Typology Code available for payment source | Admitting: Family Medicine

## 2021-05-09 VITALS — BP 122/98 | HR 94 | Ht 74.0 in | Wt 241.0 lb

## 2021-05-09 DIAGNOSIS — M7061 Trochanteric bursitis, right hip: Secondary | ICD-10-CM | POA: Diagnosis not present

## 2021-05-09 DIAGNOSIS — G8929 Other chronic pain: Secondary | ICD-10-CM | POA: Insufficient documentation

## 2021-05-09 DIAGNOSIS — M1611 Unilateral primary osteoarthritis, right hip: Secondary | ICD-10-CM

## 2021-05-09 DIAGNOSIS — M533 Sacrococcygeal disorders, not elsewhere classified: Secondary | ICD-10-CM

## 2021-05-09 MED ORDER — TRIAMCINOLONE ACETONIDE 40 MG/ML IJ SUSP
120.0000 mg | Freq: Once | INTRAMUSCULAR | Status: AC
  Administered 2021-05-09: 120 mg via INTRAMUSCULAR

## 2021-05-09 MED ORDER — CELECOXIB 200 MG PO CAPS
ORAL_CAPSULE | ORAL | 0 refills | Status: DC
  Filled 2021-05-09: qty 60, 30d supply, fill #0

## 2021-05-09 NOTE — Progress Notes (Signed)
?  ? ?Primary Care / Sports Medicine Office Visit ? ?Patient Information:  ?Patient ID: Cody [name], male DOB: 1970-09-11 Age: 51 y.o. MRN: [id number]  ? ?Cody [name] is a pleasant 51 y.o. male presenting with the following: ? ?Chief Complaint  ?Patient presents with  ? Hip Pain  ?  Right   ? ? ?Vitals:  ? 05/09/21 1112  ?BP: (!) 122/98  ?Pulse: 94  ?SpO2: 94%  ? ?Vitals:  ? 05/09/21 1112  ?Weight: 241 lb (109.3 kg)  ?Height: 6\' 2"  (1.88 m)  ? ?Body mass index is 30.94 kg/m?. ? ?No results found.  ? ?Independent interpretation of notes and tests performed by another provider:  ? ?None ? ?Procedures performed:  ? ?Procedure:  Injection of right hip joint under ultrasound guidance. ?Ultrasound guidance utilized for in-plane approach to right hip, no effusion noted ?Samsung HS60 device utilized with permanent recording / reporting. ?Verbal informed consent obtained and verified. ?Skin prepped in a sterile fashion. ?Ethyl chloride for topical local analgesia.  ?Completed without difficulty and tolerated well. ?Medication: triamcinolone acetonide 40 mg/mL suspension for injection 1 mL total and 2 mL lidocaine 1% without epinephrine utilized for needle placement anesthetic ?Advised to contact for fevers/chills, erythema, induration, drainage, or persistent bleeding. ? ?Procedure:  Injection of right greater trochanter under ultrasound guidance. ?Ultrasound guidance utilized for out of plane approach, no trochanteric bursitis visualized ?Samsung HS60 device utilized with permanent recording / reporting. ?Verbal informed consent obtained and verified. ?Skin prepped in a sterile fashion. ?Ethyl chloride for topical local analgesia.  ?Completed without difficulty and tolerated well. ?Medication: triamcinolone acetonide 40 mg/mL suspension for injection 1 mL total and 2 mL lidocaine 1% without epinephrine utilized for needle placement anesthetic ?Advised to contact for fevers/chills, erythema, induration, drainage, or  persistent bleeding. ? ?Procedure:  Injection of right SI joint under ultrasound guidance. ?Ultrasound guidance utilized for in-plane medial to lateral approach, effusion noted ?Samsung HS60 device utilized with permanent recording / reporting. ?Verbal informed consent obtained and verified. ?Skin prepped in a sterile fashion. ?Ethyl chloride for topical local analgesia.  ?Completed without difficulty and tolerated well. ?Medication: triamcinolone acetonide 40 mg/mL suspension for injection 1 mL total and 2 mL lidocaine 1% without epinephrine utilized for needle placement anesthetic ?Advised to contact for fevers/chills, erythema, induration, drainage, or persistent bleeding. ? ? ?Pertinent History, Exam, Impression, and Recommendations:  ? ?Primary osteoarthritis of right hip ?Chronic condition that has again become symptomatic for the past several weeks in the setting of relative overuse.  Denies any symptoms radiating down the leg, localized primarily to the anterolateral hip with weightbearing. ? ?Given his reported response to injections in the past, we discussed this among other treatment options and he did elect to proceed with ultrasound-guided right hip joint injection.  Post care reviewed.  I did encourage patient that physical therapy will be beneficial in optimizing his conservative care course, we also discussed surgical options for this issue.  He is amenable with PT, referral placed, Celebrex written for as needed dosing. ? ?Chronic condition, symptomatic, Rx management ? ?Greater trochanteric bursitis, right ?Acute on chronic condition in the setting of right hip osteoarthritis related arthralgia.  This can be considered a secondary/compensatory finding.  Given his symptomatology, he did elect to proceed with ultrasound-guided right greater trochanteric cortisone injection.  Post care reviewed.  He is amenable to formal physical therapy, adjunct as needed Celebrex written for as well. ? ?Chronic  right SI joint pain ?Patient has noted recurrence of  right posterior hip pain, secondary to right hip joint osteoarthritis related arthralgia.  We reviewed treatment strategies and he did elect to proceed with ultrasound-guided right SI joint injection.  Additionally PT referral placed, as needed Celebrex prescribed.  ? ?Orders & Medications ?Meds ordered this encounter  ?Medications  ? celecoxib (CELEBREX) 200 MG capsule  ?  Sig: One to 2 tablets by mouth daily as needed for pain.  ?  Dispense:  60 capsule  ?  Refill:  0  ? triamcinolone acetonide (KENALOG-40) injection 120 mg  ? ?Orders Placed This Encounter  ?Procedures  ? [place] LIMITED JOINT SPACE STRUCTURES LOW RIGHT  ? Ambulatory referral to Physical Therapy  ?  ? ?Return if symptoms worsen or fail to improve.  ?  ? ?Jerrol Banana, MD ? ? Primary Care Sports Medicine ?[facility] ?[facility]  ? ?

## 2021-05-09 NOTE — Assessment & Plan Note (Signed)
Acute on chronic condition in the setting of right hip osteoarthritis related arthralgia.  This can be considered a secondary/compensatory finding.  Given his symptomatology, he did elect to proceed with ultrasound-guided right greater trochanteric cortisone injection.  Post care reviewed.  He is amenable to formal physical therapy, adjunct as needed Celebrex written for as well. ?

## 2021-05-09 NOTE — Patient Instructions (Addendum)
You have just been given a cortisone injection to reduce pain and inflammation. After the injection you may notice immediate relief of pain as a result of the Lidocaine. It is important to rest the area of the injection for 24 to 48 hours after the injection. There is a possibility of some temporary increased discomfort and swelling for up to 72 hours until the cortisone begins to work. If you do have pain, simply rest the joint and use ice. If you can tolerate over the counter medications, you can try Tylenol, Aleve, or Advil for added relief per package instructions. ? ?- Referral coordinator will contact you to set up physical therapy ?- Can dose Celebrex, 1-2 tablets daily on as-needed basis until symptoms respond to cortisone, additionally can be used to bridge pain while performing PT ?- Contact for any questions and follow-up as needed ?

## 2021-05-09 NOTE — Assessment & Plan Note (Signed)
Patient has noted recurrence of right posterior hip pain, secondary to right hip joint osteoarthritis related arthralgia.  We reviewed treatment strategies and he did elect to proceed with ultrasound-guided right SI joint injection.  Additionally PT referral placed, as needed Celebrex prescribed. ?

## 2021-05-09 NOTE — Assessment & Plan Note (Signed)
Chronic condition that has again become symptomatic for the past several weeks in the setting of relative overuse.  Denies any symptoms radiating down the leg, localized primarily to the anterolateral hip with weightbearing. ? ?Given his reported response to injections in the past, we discussed this among other treatment options and he did elect to proceed with ultrasound-guided right hip joint injection.  Post care reviewed.  I did encourage patient that physical therapy will be beneficial in optimizing his conservative care course, we also discussed surgical options for this issue.  He is amenable with PT, referral placed, Celebrex written for as needed dosing. ? ?Chronic condition, symptomatic, Rx management ?

## 2021-05-10 ENCOUNTER — Other Ambulatory Visit: Payer: Self-pay

## 2021-05-26 ENCOUNTER — Other Ambulatory Visit: Payer: Self-pay

## 2021-07-15 ENCOUNTER — Other Ambulatory Visit: Payer: Self-pay

## 2021-08-18 ENCOUNTER — Other Ambulatory Visit (HOSPITAL_COMMUNITY): Payer: Self-pay

## 2021-08-22 ENCOUNTER — Other Ambulatory Visit: Payer: Self-pay

## 2021-08-22 ENCOUNTER — Other Ambulatory Visit (HOSPITAL_COMMUNITY): Payer: Self-pay

## 2021-10-04 ENCOUNTER — Encounter: Payer: Self-pay | Admitting: Family Medicine

## 2021-10-25 ENCOUNTER — Ambulatory Visit (INDEPENDENT_AMBULATORY_CARE_PROVIDER_SITE_OTHER): Payer: No Typology Code available for payment source | Admitting: Family Medicine

## 2021-10-25 ENCOUNTER — Encounter: Payer: Self-pay | Admitting: Family Medicine

## 2021-10-25 ENCOUNTER — Inpatient Hospital Stay (INDEPENDENT_AMBULATORY_CARE_PROVIDER_SITE_OTHER): Payer: No Typology Code available for payment source | Admitting: Radiology

## 2021-10-25 ENCOUNTER — Other Ambulatory Visit: Payer: Self-pay

## 2021-10-25 VITALS — BP 132/86 | HR 83 | Ht 74.0 in | Wt 229.0 lb

## 2021-10-25 DIAGNOSIS — M1611 Unilateral primary osteoarthritis, right hip: Secondary | ICD-10-CM

## 2021-10-25 DIAGNOSIS — M533 Sacrococcygeal disorders, not elsewhere classified: Secondary | ICD-10-CM | POA: Diagnosis not present

## 2021-10-25 DIAGNOSIS — M7061 Trochanteric bursitis, right hip: Secondary | ICD-10-CM | POA: Diagnosis not present

## 2021-10-25 DIAGNOSIS — G8929 Other chronic pain: Secondary | ICD-10-CM | POA: Diagnosis not present

## 2021-10-25 MED ORDER — PIROXICAM 20 MG PO CAPS
20.0000 mg | ORAL_CAPSULE | Freq: Every day | ORAL | 0 refills | Status: DC | PRN
  Filled 2021-10-25: qty 30, 30d supply, fill #0

## 2021-10-25 MED ORDER — TRIAMCINOLONE ACETONIDE 40 MG/ML IJ SUSP
120.0000 mg | Freq: Once | INTRAMUSCULAR | Status: AC
  Administered 2021-10-25: 120 mg via INTRAMUSCULAR

## 2021-10-25 NOTE — Assessment & Plan Note (Signed)
Acute on chronic exacerbation of right SI joint osteoarthritis, See additional assessment(s) for plan details.

## 2021-10-25 NOTE — Progress Notes (Signed)
Primary Care / Sports Medicine Office Visit  Patient Information:  Patient ID: Cody [name], male DOB: [birth year] Age: 51 y.o. MRN: [id number]   Cody [name] is a pleasant 51 y.o. male presenting with the following:  Chief Complaint  Patient presents with   Primary osteoarthritis of right hip    Vitals:   10/25/21 1405  BP: 132/86  Pulse: 83  SpO2: 97%   Vitals:   10/25/21 1405  Weight: 229 lb (103.9 kg)  Height: 6\' 2"  (1.88 m)   Body mass index is 29.4 kg/m.  No results found.   Independent interpretation of notes and tests performed by another provider:   None  Procedures performed:   Procedure:  Injection of right hip joint under ultrasound guidance. Ultrasound guidance utilized for in-plane approach to the right femoroacetabular junction, no effusion noted Samsung HS60 device utilized with permanent recording / reporting. Verbal informed consent obtained and verified. Skin prepped in a sterile fashion. Ethyl chloride for topical local analgesia.  Completed without difficulty and tolerated well. Medication: triamcinolone acetonide 40 mg/mL suspension for injection 1 mL total and 2 mL lidocaine 1% without epinephrine utilized for needle placement anesthetic Advised to contact for fevers/chills, erythema, induration, drainage, or persistent bleeding.  Procedure:  Injection of right greater trochanter under ultrasound guidance. Ultrasound guidance utilized for out of plane approach to the right greater trochanter, no bursitis sonographically visualized Samsung HS60 device utilized with permanent recording / reporting. Verbal informed consent obtained and verified. Skin prepped in a sterile fashion. Ethyl chloride for topical local analgesia.  Completed without difficulty and tolerated well. Medication: triamcinolone acetonide 40 mg/mL suspension for injection 1 mL total and 2 mL lidocaine 1% without epinephrine utilized for needle placement  anesthetic Advised to contact for fevers/chills, erythema, induration, drainage, or persistent bleeding.  Procedure:  Injection of right sacroiliac joint under ultrasound guidance. Ultrasound guidance utilized for in-plane approach to the right sacroiliac joint from a medial to lateral trajectory, mild cortical roughening consistent with degenerative changes noted Samsung HS60 device utilized with permanent recording / reporting. Verbal informed consent obtained and verified. Skin prepped in a sterile fashion. Ethyl chloride for topical local analgesia.  Completed without difficulty and tolerated well. Medication: triamcinolone acetonide 40 mg/mL suspension for injection 1 mL total and 2 mL lidocaine 1% without epinephrine utilized for needle placement anesthetic Advised to contact for fevers/chills, erythema, induration, drainage, or persistent bleeding.   Pertinent History, Exam, Impression, and Recommendations:   Problem List Items Addressed This Visit       Musculoskeletal and Integument   Primary osteoarthritis of right hip - Primary    Right hip osteoarthritis with acute on chronic symptomatology, did receive intra-articular corticosteroid injection roughly 3 months prior which did provide 3 months of symptom control.  He has noted gradual recurrence.  Given the chronicity of symptoms, treatments today, prior response, he did elect to proceed with repeat intra-articular corticosteroid injection, post care reviewed.  He would benefit from formal versus home-based PT and he has elected to perform home-based exercises.  I have written for piroxicam for adjunct symptom control.      Relevant Medications   piroxicam (FELDENE) 20 MG capsule   Other Relevant Orders   LIMITED JOINT SPACE STRUCTURES LOW RIGHT   Greater trochanteric bursitis, right    Acute on chronic exacerbation in the setting of underlying right hip moderate osteoarthritis.  Did discuss methods to work on improving  this definitively including formal versus home-based  rehab.  Given that he had a roughly 3 months of symptom control following previous corticosteroid injection, he did elect to proceed with the same today.  Post care reviewed, home exercises provided for gentle advance.  He can also dose piroxicam on as-needed basis for adjunct symptom control.      Relevant Medications   piroxicam (FELDENE) 20 MG capsule     Other   Chronic right SI joint pain    Acute on chronic exacerbation of right SI joint osteoarthritis, See additional assessment(s) for plan details.      Relevant Medications   piroxicam (FELDENE) 20 MG capsule     Orders & Medications Meds ordered this encounter  Medications   triamcinolone acetonide (KENALOG-40) injection 120 mg   piroxicam (FELDENE) 20 MG capsule    Sig: Take 1 capsule (20 mg total) by mouth daily as needed.    Dispense:  30 capsule    Refill:  0   Orders Placed This Encounter  Procedures   [place] LIMITED JOINT SPACE STRUCTURES LOW RIGHT     Return if symptoms worsen or fail to improve.     Jerrol Banana, MD   Primary Care Sports Medicine [facility]

## 2021-10-25 NOTE — Patient Instructions (Addendum)
You have just been given a cortisone injection to reduce pain and inflammation. After the injection you may notice immediate relief of pain as a result of the Lidocaine. It is important to rest the area of the injection for 24 to 48 hours after the injection. There is a possibility of some temporary increased discomfort and swelling for up to 72 hours until the cortisone begins to work. If you do have pain, simply rest the joint and use ice. If you can tolerate over the counter medications, you can try Tylenol for added relief per package instructions. - As above, relative rest for 2 days and gradual return to normal activity - Start home exercise with information provided, focus on slow and steady advance, do not push through pain - Can dose piroxicam once daily on as-needed basis for pain - Return for follow-up as needed

## 2021-10-25 NOTE — Assessment & Plan Note (Signed)
Acute on chronic exacerbation in the setting of underlying right hip moderate osteoarthritis.  Did discuss methods to work on improving this definitively including formal versus home-based rehab.  Given that he had a roughly 3 months of symptom control following previous corticosteroid injection, he did elect to proceed with the same today.  Post care reviewed, home exercises provided for gentle advance.  He can also dose piroxicam on as-needed basis for adjunct symptom control.

## 2021-10-25 NOTE — Assessment & Plan Note (Signed)
Right hip osteoarthritis with acute on chronic symptomatology, did receive intra-articular corticosteroid injection roughly 3 months prior which did provide 3 months of symptom control.  He has noted gradual recurrence.  Given the chronicity of symptoms, treatments today, prior response, he did elect to proceed with repeat intra-articular corticosteroid injection, post care reviewed.  He would benefit from formal versus home-based PT and he has elected to perform home-based exercises.  I have written for piroxicam for adjunct symptom control.

## 2021-10-26 ENCOUNTER — Other Ambulatory Visit: Payer: Self-pay

## 2021-12-06 ENCOUNTER — Other Ambulatory Visit: Payer: Self-pay | Admitting: Family Medicine

## 2021-12-06 ENCOUNTER — Other Ambulatory Visit: Payer: Self-pay

## 2021-12-06 DIAGNOSIS — G8929 Other chronic pain: Secondary | ICD-10-CM

## 2021-12-06 DIAGNOSIS — M1611 Unilateral primary osteoarthritis, right hip: Secondary | ICD-10-CM

## 2021-12-06 DIAGNOSIS — M7061 Trochanteric bursitis, right hip: Secondary | ICD-10-CM

## 2021-12-06 NOTE — Telephone Encounter (Signed)
Requested medications are due for refill today.  yes  Requested medications are on the active medications list.  yes  Last refill. 10/25/2021 #30 0 rf  Future visit scheduled.   yes  Notes to clinic.  Rx Signed by Dr. Zigmund Daniel.    Requested Prescriptions  Pending Prescriptions Disp Refills   piroxicam (FELDENE) 20 MG capsule 30 capsule 0    Sig: Take 1 capsule (20 mg total) by mouth daily as needed.     Analgesics:  NSAIDS Failed - 12/06/2021  2:32 PM      Failed - Manual Review: Labs are only required if the patient has taken medication for more than 8 weeks.      Failed - Cr in normal range and within 360 days    Creatinine, Ser  Date Value Ref Range Status  05/03/2021 1.30 (H) 0.76 - 1.27 mg/dL Final         Failed - HGB in normal range and within 360 days    Hemoglobin  Date Value Ref Range Status  10/31/2017 15.7 13.0 - 17.7 g/dL Final         Failed - PLT in normal range and within 360 days    Platelets  Date Value Ref Range Status  10/31/2017 300 150 - 450 x10E3/uL Final         Failed - HCT in normal range and within 360 days    Hematocrit  Date Value Ref Range Status  10/31/2017 45.9 37.5 - 51.0 % Final         Passed - eGFR is 30 or above and within 360 days    GFR calc Af Amer  Date Value Ref Range Status  10/31/2017 73 >59 mL/min/1.73 Final   GFR calc non Af Amer  Date Value Ref Range Status  10/31/2017 63 >59 mL/min/1.73 Final   eGFR  Date Value Ref Range Status  05/03/2021 66 >59 mL/min/1.73 Final         Passed - Patient is not pregnant      Passed - Valid encounter within last 12 months    Recent Outpatient Visits           1 month ago Primary osteoarthritis of right hip   [facility] [place], Earley Abide, MD   7 months ago Primary osteoarthritis of right hip   [facility] [place], Earley Abide, MD   7 months ago Annual physical exam    [facility] [place], Deanna C, MD   1 year ago Back pain of lumbosacral region with sciatica   [place] [place], Earley Abide, MD   1 year ago Sacroiliac joint dysfunction of left side   [facility] at [facility], Earley Abide, MD       Future Appointments             In 5 months Juline Patch, MD [facility] at [facility], [facility]

## 2021-12-07 ENCOUNTER — Other Ambulatory Visit: Payer: Self-pay | Admitting: Family Medicine

## 2021-12-07 ENCOUNTER — Other Ambulatory Visit: Payer: Self-pay

## 2021-12-07 DIAGNOSIS — M7061 Trochanteric bursitis, right hip: Secondary | ICD-10-CM

## 2021-12-07 DIAGNOSIS — M1611 Unilateral primary osteoarthritis, right hip: Secondary | ICD-10-CM

## 2021-12-07 DIAGNOSIS — G8929 Other chronic pain: Secondary | ICD-10-CM

## 2021-12-07 MED ORDER — PIROXICAM 20 MG PO CAPS
20.0000 mg | ORAL_CAPSULE | Freq: Every day | ORAL | 0 refills | Status: DC | PRN
  Filled 2021-12-07: qty 60, 60d supply, fill #0

## 2021-12-09 ENCOUNTER — Encounter: Payer: Self-pay | Admitting: Family Medicine

## 2021-12-09 NOTE — Telephone Encounter (Signed)
Please review.  KP

## 2022-05-09 ENCOUNTER — Encounter: Payer: Self-pay | Admitting: Family Medicine

## 2022-05-09 ENCOUNTER — Ambulatory Visit (INDEPENDENT_AMBULATORY_CARE_PROVIDER_SITE_OTHER): Payer: 59 | Admitting: Family Medicine

## 2022-05-09 ENCOUNTER — Other Ambulatory Visit: Payer: Self-pay

## 2022-05-09 VITALS — BP 122/98 | HR 74 | Ht 74.0 in | Wt 226.0 lb

## 2022-05-09 DIAGNOSIS — F5101 Primary insomnia: Secondary | ICD-10-CM

## 2022-05-09 DIAGNOSIS — I10 Essential (primary) hypertension: Secondary | ICD-10-CM | POA: Diagnosis not present

## 2022-05-09 MED ORDER — TRAZODONE HCL 50 MG PO TABS
25.0000 mg | ORAL_TABLET | Freq: Every evening | ORAL | 3 refills | Status: DC | PRN
  Filled 2022-05-09: qty 30, 30d supply, fill #0
  Filled 2022-06-07: qty 30, 30d supply, fill #1
  Filled 2022-06-26 – 2022-07-07 (×2): qty 30, 30d supply, fill #2
  Filled 2022-08-07: qty 30, 30d supply, fill #3

## 2022-05-09 MED ORDER — LISINOPRIL 5 MG PO TABS
5.0000 mg | ORAL_TABLET | Freq: Every day | ORAL | 1 refills | Status: DC
  Filled 2022-05-09: qty 60, 60d supply, fill #0

## 2022-05-09 NOTE — Progress Notes (Signed)
Date:  05/09/2022   Name:  Cody [name]   DOB:  06-12-1970   MRN:  [id number]   Chief Complaint: Hypertension  Hypertension This is a chronic problem. The problem is uncontrolled. Pertinent negatives include no chest pain, palpitations or shortness of breath. There are no associated agents to hypertension. Past treatments include ACE inhibitors (not taking medication). Compliance problems: not taking meds.     Lab Results  Component Value Date   NA 142 05/03/2021   K 4.3 05/03/2021   CO2 24 05/03/2021   GLUCOSE 95 05/03/2021   BUN 13 05/03/2021   CREATININE 1.30 (H) 05/03/2021   CALCIUM 9.4 05/03/2021   EGFR 66 05/03/2021   GFRNONAA 63 10/31/2017   Lab Results  Component Value Date   CHOL 273 (H) 05/03/2021   HDL 45 05/03/2021   LDLCALC 216 (H) 05/03/2021   TRIG 76 05/03/2021   CHOLHDL 3.7 05/18/2017   Lab Results  Component Value Date   TSH 2.720 10/31/2017   No results found for: "HGBA1C" Lab Results  Component Value Date   WBC 5.2 10/31/2017   HGB 15.7 10/31/2017   HCT 45.9 10/31/2017   MCV 87 10/31/2017   PLT 300 10/31/2017   Lab Results  Component Value Date   ALT 27 05/03/2021   AST 26 05/03/2021   ALKPHOS 61 05/03/2021   BILITOT 0.7 05/03/2021   No results found for: "25OHVITD2", "25OHVITD3", "VD25OH"   Review of Systems  Respiratory:  Negative for cough, chest tightness, shortness of breath and wheezing.   Cardiovascular:  Negative for chest pain, palpitations and leg swelling.    Patient Active Problem List   Diagnosis Date Noted   Chronic right SI joint pain 05/09/2021   History of lumbar laminectomy 10/25/2020   Chronic radicular lumbar pain 10/25/2020   Neuropathic pain 10/25/2020   Lumbar facet arthropathy 10/25/2020   Chronic pain syndrome 10/25/2020   Piriformis syndrome, left 10/05/2020   Sacroiliac joint dysfunction of left side 10/04/2020   Back pain of lumbosacral region with sciatica 10/04/2020   Greater trochanteric  bursitis, right 10/04/2020   Primary osteoarthritis of right hip 07/06/2020   Gluteal tendinitis, right hip 06/22/2020   Onychomycosis 09/12/2015   CN (constipation) 01/02/2014    No Known Allergies  Past Surgical History:  Procedure Laterality Date   APPENDECTOMY  2005   incidental at time of colon surgery.   BACK SURGERY  2000   COLON SURGERY  2005   Left colectomy for redundant colon with severe constipation.   COLONOSCOPY     COLONOSCOPY WITH PROPOFOL N/A 06/09/2020   Procedure: COLONOSCOPY WITH PROPOFOL;  Surgeon: Robert Bellow, MD;  Location: [facility] ENDOSCOPY;  Service: Endoscopy;  Laterality: N/A;   HERNIA REPAIR  12/16/2003   ventral hernia repair with Compsix Kugel mesh    Social History   Tobacco Use   Smoking status: Never   Smokeless tobacco: Never  Vaping Use   Vaping Use: Never used  Substance Use Topics   Alcohol use: Not Currently    Alcohol/week: 0.0 standard drinks of alcohol   Drug use: Never     Medication list has been reviewed and updated.  Current Meds  Medication Sig   traZODone (DESYREL) 50 MG tablet Take 0.5-1 tablets (25-50 mg total) by mouth at bedtime as needed for sleep.       05/09/2022    9:46 AM 05/09/2021   11:12 AM 05/03/2021    9:55 AM 10/05/2020  1:12 PM  GAD 7 : Generalized Anxiety Score  Nervous, Anxious, on Edge 0 0 0 0  Control/stop worrying 0 0 0 0  Worry too much - different things 0 0 0 0  Trouble relaxing 0 0 0 0  Restless 0 0 0 0  Easily annoyed or irritable 0 0 0 0  Afraid - awful might happen 0 0 0 0  Total GAD 7 Score 0 0 0 0  Anxiety Difficulty Not difficult at all  Not difficult at all Not difficult at all       05/09/2022    9:46 AM 05/09/2021   11:12 AM 05/03/2021    9:54 AM  Depression screen PHQ 2/9  Decreased Interest 0 0 0  Down, Depressed, Hopeless 0 0 0  PHQ - 2 Score 0 0 0  Altered sleeping 0 2 2  Tired, decreased energy 0 0 0  Change in appetite 0 0 0  Feeling bad or failure about yourself   0 0 0  Trouble concentrating 0 0 0  Moving slowly or fidgety/restless 0 0 0  Suicidal thoughts 0 0 0  PHQ-9 Score 0 2 2  Difficult doing work/chores Not difficult at all Not difficult at all Not difficult at all    BP Readings from Last 3 Encounters:  05/09/22 (!) 122/98  10/25/21 132/86  05/09/21 (!) 122/98    Physical Exam HENT:     Head: Normocephalic.  Cardiovascular:     Rate and Rhythm: Normal rate and regular rhythm.     Pulses: Normal pulses.     Heart sounds: Normal heart sounds.  Pulmonary:     Breath sounds: Normal breath sounds.  Neurological:     Mental Status: He is alert.     Wt Readings from Last 3 Encounters:  05/09/22 226 lb (102.5 kg)  10/25/21 229 lb (103.9 kg)  05/09/21 241 lb (109.3 kg)    BP (!) 122/98 (BP Location: Right Arm, Cuff Size: Large)   Pulse 74   Ht '6\' 2"'$  (1.88 m)   Wt 226 lb (102.5 kg)   SpO2 99%   BMI 29.02 kg/m   Assessment and Plan:  1. Primary hypertension  We reemphasized the importance of taking meds Chronic.  Uncontrolled.  Stable.  Patient has not been taking his medication.  We reemphasized the importance of taking medications and discussed the different levels of hypertension and that patient is in stage II hypertension with a diastolic above 90.  Patient understands that he needs to take this medication and will resume his lisinopril 5 mg once a day.  Will recheck patient in 6 weeks. 2. Primary insomnia Patient has been having some issues with insomnia and currently is taking a significant dosing of melatonin without results.  We will begin trazodone 50 mg 1/2 to 1 tablet nightly and will recheck in 6 weeks. - traZODone (DESYREL) 50 MG tablet; Take 0.5-1 tablets (25-50 mg total) by mouth at bedtime as needed for sleep.  Dispense: 30 tablet; Refill: 3    Otilio Miu, MD

## 2022-05-09 NOTE — Patient Instructions (Signed)
Managing Your Hypertension Hypertension, also called high blood pressure, is when the force of the blood pressing against the walls of the arteries is too strong. Arteries are blood vessels that carry blood from your heart throughout your body. Hypertension forces the heart to work harder to pump blood and may cause the arteries to become narrow or stiff. Understanding blood pressure readings A blood pressure reading includes a higher number over a lower number: The first, or top, number is called the systolic pressure. It is a measure of the pressure in your arteries as your heart beats. The second, or bottom number, is called the diastolic pressure. It is a measure of the pressure in your arteries as the heart relaxes. For most people, a normal blood pressure is below 120/80. Your personal target blood pressure may vary depending on your medical conditions, your age, and other factors. Blood pressure is classified into four stages. Based on your blood pressure reading, your health care provider may use the following stages to determine what type of treatment you need, if any. Systolic pressure and diastolic pressure are measured in a unit called millimeters of mercury (mmHg). Normal Systolic pressure: below 120. Diastolic pressure: below 80. Elevated Systolic pressure: 120-129. Diastolic pressure: below 80. Hypertension stage 1 Systolic pressure: 130-139. Diastolic pressure: 80-89. Hypertension stage 2 Systolic pressure: 140 or above. Diastolic pressure: 90 or above. How can this condition affect me? Managing your hypertension is very important. Over time, hypertension can damage the arteries and decrease blood flow to parts of the body, including the brain, heart, and kidneys. Having untreated or uncontrolled hypertension can lead to: A heart attack. A stroke. A weakened blood vessel (aneurysm). Heart failure. Kidney damage. Eye damage. Memory and concentration problems. Vascular  dementia. What actions can I take to manage this condition? Hypertension can be managed by making lifestyle changes and possibly by taking medicines. Your health care provider will help you make a plan to bring your blood pressure within a normal range. You may be referred for counseling on a healthy diet and physical activity. Nutrition  Eat a diet that is high in fiber and potassium, and low in salt (sodium), added sugar, and fat. An example eating plan is called the DASH diet. DASH stands for Dietary Approaches to Stop Hypertension. To eat this way: Eat plenty of fresh fruits and vegetables. Try to fill one-half of your plate at each meal with fruits and vegetables. Eat whole grains, such as whole-wheat pasta, brown rice, or whole-grain bread. Fill about one-fourth of your plate with whole grains. Eat low-fat dairy products. Avoid fatty cuts of meat, processed or cured meats, and poultry with skin. Fill about one-fourth of your plate with lean proteins such as fish, chicken without skin, beans, eggs, and tofu. Avoid pre-made and processed foods. These tend to be higher in sodium, added sugar, and fat. Reduce your daily sodium intake. Many people with hypertension should eat less than 1,500 mg of sodium a day. Lifestyle  Work with your health care provider to maintain a healthy body weight or to lose weight. Ask what an ideal weight is for you. Get at least 30 minutes of exercise that causes your heart to beat faster (aerobic exercise) most days of the week. Activities may include walking, swimming, or biking. Include exercise to strengthen your muscles (resistance exercise), such as weight lifting, as part of your weekly exercise routine. Try to do these types of exercises for 30 minutes at least 3 days a week. Do   not use any products that contain nicotine or tobacco. These products include cigarettes, chewing tobacco, and vaping devices, such as e-cigarettes. If you need help quitting, ask your  health care provider. Control any long-term (chronic) conditions you have, such as high cholesterol or diabetes. Identify your sources of stress and find ways to manage stress. This may include meditation, deep breathing, or making time for fun activities. Alcohol use Do not drink alcohol if: Your health care provider tells you not to drink. You are pregnant, may be pregnant, or are planning to become pregnant. If you drink alcohol: Limit how much you have to: 0-1 drink a day for women. 0-2 drinks a day for men. Know how much alcohol is in your drink. In the [place] one drink equals one 12 oz bottle of beer (355 mL), one 5 oz glass of wine (148 mL), or one 1 oz glass of hard liquor (44 mL). Medicines Your health care provider may prescribe medicine if lifestyle changes are not enough to get your blood pressure under control and if: Your systolic blood pressure is 130 or higher. Your diastolic blood pressure is 80 or higher. Take medicines only as told by your health care provider. Follow the directions carefully. Blood pressure medicines must be taken as told by your health care provider. The medicine does not work as well when you skip doses. Skipping doses also puts you at risk for problems. Monitoring Before you monitor your blood pressure: Do not smoke, drink caffeinated beverages, or exercise within 30 minutes before taking a measurement. Use the bathroom and empty your bladder (urinate). Sit quietly for at least 5 minutes before taking measurements. Monitor your blood pressure at home as told by your health care provider. To do this: Sit with your back straight and supported. Place your feet flat on the floor. Do not cross your legs. Support your arm on a flat surface, such as a table. Make sure your upper arm is at heart level. Each time you measure, take two or three readings one minute apart and record the results. You may also need to have your blood pressure checked regularly by  your health care provider. General information Talk with your health care provider about your diet, exercise habits, and other lifestyle factors that may be contributing to hypertension. Review all the medicines you take with your health care provider because there may be side effects or interactions. Keep all follow-up visits. Your health care provider can help you create and adjust your plan for managing your high blood pressure. Where to find more information National Heart, Lung, and Blood Institute: www.nhlbi.nih.gov American Heart Association: www.heart.org Contact a health care provider if: You think you are having a reaction to medicines you have taken. You have repeated (recurrent) headaches. You feel dizzy. You have swelling in your ankles. You have trouble with your vision. Get help right away if: You develop a severe headache or confusion. You have unusual weakness or numbness, or you feel faint. You have severe pain in your chest or abdomen. You vomit repeatedly. You have trouble breathing. These symptoms may be an emergency. Get help right away. Call 911. Do not wait to see if the symptoms will go away. Do not drive yourself to the hospital. Summary Hypertension is when the force of blood pumping through your arteries is too strong. If this condition is not controlled, it may put you at risk for serious complications. Your personal target blood pressure may vary depending on your medical conditions,   your age, and other factors. For most people, a normal blood pressure is less than 120/80. Hypertension is managed by lifestyle changes, medicines, or both. Lifestyle changes to help manage hypertension include losing weight, eating a healthy, low-sodium diet, exercising more, stopping smoking, and limiting alcohol. This information is not intended to replace advice given to you by your health care provider. Make sure you discuss any questions you have with your health care  provider. Document Revised: 10/28/2020 Document Reviewed: 10/28/2020 Elsevier Patient Education  2023 Elsevier Inc.  

## 2022-06-07 ENCOUNTER — Other Ambulatory Visit: Payer: Self-pay

## 2022-06-22 ENCOUNTER — Other Ambulatory Visit: Payer: Self-pay

## 2022-06-22 ENCOUNTER — Ambulatory Visit (INDEPENDENT_AMBULATORY_CARE_PROVIDER_SITE_OTHER): Payer: 59 | Admitting: Family Medicine

## 2022-06-22 ENCOUNTER — Encounter: Payer: Self-pay | Admitting: Family Medicine

## 2022-06-22 VITALS — BP 124/78 | HR 81 | Ht 74.0 in | Wt 227.0 lb

## 2022-06-22 DIAGNOSIS — I10 Essential (primary) hypertension: Secondary | ICD-10-CM

## 2022-06-22 MED ORDER — LISINOPRIL 5 MG PO TABS
5.0000 mg | ORAL_TABLET | Freq: Every day | ORAL | 1 refills | Status: DC
  Filled 2022-06-22: qty 90, 90d supply, fill #0
  Filled 2022-10-27: qty 90, 90d supply, fill #1

## 2022-06-22 NOTE — Patient Instructions (Signed)

## 2022-06-22 NOTE — Progress Notes (Addendum)
Date:  06/22/2022   Name:  KARDELL VIRGIL   DOB:  Nov 11, 1970   MRN:  [id number]   Chief Complaint: Hypertension  Hypertension This is a chronic problem. The current episode started more than 1 year ago. The problem has been waxing and waning since onset. The problem is controlled. Pertinent negatives include no blurred vision, chest pain, headaches, neck pain, orthopnea, palpitations or shortness of breath. There are no associated agents to hypertension. Past treatments include ACE inhibitors. The current treatment provides moderate improvement. There are no compliance problems.     Lab Results  Component Value Date   NA 142 05/03/2021   K 4.3 05/03/2021   CO2 24 05/03/2021   GLUCOSE 95 05/03/2021   BUN 13 05/03/2021   CREATININE 1.30 (H) 05/03/2021   CALCIUM 9.4 05/03/2021   EGFR 66 05/03/2021   GFRNONAA 63 10/31/2017   Lab Results  Component Value Date   CHOL 273 (H) 05/03/2021   HDL 45 05/03/2021   LDLCALC 216 (H) 05/03/2021   TRIG 76 05/03/2021   CHOLHDL 3.7 05/18/2017   Lab Results  Component Value Date   TSH 2.720 10/31/2017   No results found for: "HGBA1C" Lab Results  Component Value Date   WBC 5.2 10/31/2017   HGB 15.7 10/31/2017   HCT 45.9 10/31/2017   MCV 87 10/31/2017   PLT 300 10/31/2017   Lab Results  Component Value Date   ALT 27 05/03/2021   AST 26 05/03/2021   ALKPHOS 61 05/03/2021   BILITOT 0.7 05/03/2021   No results found for: "25OHVITD2", "25OHVITD3", "VD25OH"   Review of Systems  Constitutional:  Negative for chills and fever.  HENT:  Negative for drooling, ear discharge, ear pain and sore throat.   Eyes:  Negative for blurred vision.  Respiratory:  Negative for cough, shortness of breath and wheezing.   Cardiovascular:  Negative for chest pain, palpitations, orthopnea and leg swelling.  Gastrointestinal:  Negative for abdominal pain, blood in stool, constipation, diarrhea and nausea.  Endocrine: Negative for polydipsia.   Genitourinary:  Negative for dysuria, frequency, hematuria and urgency.  Musculoskeletal:  Negative for back pain, myalgias and neck pain.  Skin:  Negative for rash.  Allergic/Immunologic: Negative for environmental allergies.  Neurological:  Negative for dizziness and headaches.  Hematological:  Does not bruise/bleed easily.  Psychiatric/Behavioral:  Negative for suicidal ideas. The patient is not nervous/anxious.     Patient Active Problem List   Diagnosis Date Noted   Chronic right SI joint pain 05/09/2021   History of lumbar laminectomy 10/25/2020   Chronic radicular lumbar pain 10/25/2020   Neuropathic pain 10/25/2020   Lumbar facet arthropathy 10/25/2020   Chronic pain syndrome 10/25/2020   Piriformis syndrome, left 10/05/2020   Sacroiliac joint dysfunction of left side 10/04/2020   Back pain of lumbosacral region with sciatica 10/04/2020   Greater trochanteric bursitis, right 10/04/2020   Primary osteoarthritis of right hip 07/06/2020   Gluteal tendinitis, right hip 06/22/2020   Onychomycosis 09/12/2015   CN (constipation) 01/02/2014    No Known Allergies  Past Surgical History:  Procedure Laterality Date   APPENDECTOMY  2005   incidental at time of colon surgery.   BACK SURGERY  2000   COLON SURGERY  2005   Left colectomy for redundant colon with severe constipation.   COLONOSCOPY     COLONOSCOPY WITH PROPOFOL N/A 06/09/2020   Procedure: COLONOSCOPY WITH PROPOFOL;  Surgeon: Earline Mayotte, MD;  Location: [facility] ENDOSCOPY;  Service:  Endoscopy;  Laterality: N/A;   HERNIA REPAIR  12/16/2003   ventral hernia repair with Compsix Kugel mesh    Social History   Tobacco Use   Smoking status: Never   Smokeless tobacco: Never  Vaping Use   Vaping Use: Never used  Substance Use Topics   Alcohol use: Not Currently    Alcohol/week: 0.0 standard drinks of alcohol   Drug use: Never     Medication list has been reviewed and updated.  Current Meds  Medication Sig    lisinopril (ZESTRIL) 5 MG tablet Take 1 tablet (5 mg total) by mouth daily.   traZODone (DESYREL) 50 MG tablet Take 0.5-1 tablets (25-50 mg total) by mouth at bedtime as needed for sleep.       06/22/2022    9:45 AM 05/09/2022    9:46 AM 05/09/2021   11:12 AM 05/03/2021    9:55 AM  GAD 7 : Generalized Anxiety Score  Nervous, Anxious, on Edge 0 0 0 0  Control/stop worrying 0 0 0 0  Worry too much - different things 0 0 0 0  Trouble relaxing 0 0 0 0  Restless 0 0 0 0  Easily annoyed or irritable 0 0 0 0  Afraid - awful might happen 0 0 0 0  Total GAD 7 Score 0 0 0 0  Anxiety Difficulty Not difficult at all Not difficult at all  Not difficult at all       06/22/2022    9:44 AM 05/09/2022    9:46 AM 05/09/2021   11:12 AM  Depression screen PHQ 2/9  Decreased Interest 0 0 0  Down, Depressed, Hopeless 0 0 0  PHQ - 2 Score 0 0 0  Altered sleeping 0 0 2  Tired, decreased energy 0 0 0  Change in appetite 0 0 0  Feeling bad or failure about yourself  0 0 0  Trouble concentrating 0 0 0  Moving slowly or fidgety/restless 0 0 0  Suicidal thoughts 0 0 0  PHQ-9 Score 0 0 2  Difficult doing work/chores Not difficult at all Not difficult at all Not difficult at all    BP Readings from Last 3 Encounters:  06/22/22 124/78  05/09/22 (!) 122/98  10/25/21 132/86    Physical Exam Vitals and nursing note reviewed.  HENT:     Head: Normocephalic.     Nose: No congestion or rhinorrhea.  Eyes:     Conjunctiva/sclera: Conjunctivae normal.  Neck:     Thyroid: No thyromegaly.     Vascular: No JVD.     Trachea: No tracheal deviation.  Cardiovascular:     Rate and Rhythm: Normal rate and regular rhythm.     Heart sounds: Normal heart sounds. No murmur heard.    No friction rub. No gallop.  Pulmonary:     Effort: No respiratory distress.     Breath sounds: Normal breath sounds. No wheezing or rales.  Abdominal:     Palpations: Abdomen is soft.     Tenderness: There is no abdominal  tenderness.  Musculoskeletal:        General: No tenderness.     Cervical back: Neck supple.  Lymphadenopathy:     Cervical: No cervical adenopathy.  Skin:    General: Skin is warm.     Findings: No rash.  Neurological:     Mental Status: He is alert.     Wt Readings from Last 3 Encounters:  06/22/22 227 lb (103 kg)  05/09/22  226 lb (102.5 kg)  10/25/21 229 lb (103.9 kg)    BP 124/78   Pulse 81   Ht  (1.88 m)   Wt 227 lb (103 kg)   SpO2 97%   BMI 29.15 kg/m   Assessment and Plan: 1. Primary hypertension Chronic.  Controlled.  Stable.  Blood pressure today 124/78.  Asymptomatic.  Tolerating medication well.  Continue lisinopril 5 mg once a day.  Will check lipid panel and CMP for electrolytes and GFR.  Will recheck patient with physical and will do blood pressure reading at that time if it remains relatively simple. - Lipid Panel With LDL/HDL Ratio - Comprehensive Metabolic Panel (CMET) - lisinopril (ZESTRIL) 5 MG tablet; Take 1 tablet (5 mg total) by mouth daily.  Dispense: 90 tablet; Refill: 1     Elizabeth Sauer, MD

## 2022-06-23 LAB — LIPID PANEL WITH LDL/HDL RATIO
Cholesterol, Total: 264 mg/dL — ABNORMAL HIGH (ref 100–199)
HDL: 48 mg/dL (ref >39)
LDL Chol Calc (NIH): 203 mg/dL — ABNORMAL HIGH (ref 0–99)
LDL/HDL Ratio: 4.2 ratio — ABNORMAL HIGH (ref 0.0–3.6)
Triglycerides: 80 mg/dL (ref 0–149)
VLDL Cholesterol Cal: 13 mg/dL (ref 5–40)

## 2022-06-23 LAB — COMPREHENSIVE METABOLIC PANEL
ALT: 23 IU/L (ref 0–44)
AST: 25 IU/L (ref 0–40)
Albumin/Globulin Ratio: 2 (ref 1.2–2.2)
Albumin: 4.7 g/dL (ref 3.8–4.9)
Alkaline Phosphatase: 63 IU/L (ref 44–121)
BUN/Creatinine Ratio: 12 (ref 9–20)
BUN: 15 mg/dL (ref 6–24)
Bilirubin Total: 0.5 mg/dL (ref 0.0–1.2)
CO2: 22 mmol/L (ref 20–29)
Calcium: 9.6 mg/dL (ref 8.7–10.2)
Chloride: 104 mmol/L (ref 96–106)
Creatinine, Ser: 1.24 mg/dL (ref 0.76–1.27)
Globulin, Total: 2.3 g/dL (ref 1.5–4.5)
Glucose: 97 mg/dL (ref 70–99)
Potassium: 4.5 mmol/L (ref 3.5–5.2)
Sodium: 141 mmol/L (ref 134–144)
Total Protein: 7 g/dL (ref 6.0–8.5)
eGFR: 69 mL/min/{1.73_m2} (ref >59)

## 2022-06-27 ENCOUNTER — Other Ambulatory Visit: Payer: Self-pay

## 2022-07-10 DIAGNOSIS — M25551 Pain in right hip: Secondary | ICD-10-CM | POA: Diagnosis not present

## 2022-07-20 ENCOUNTER — Encounter: Payer: Self-pay | Admitting: Family Medicine

## 2022-07-20 ENCOUNTER — Ambulatory Visit (INDEPENDENT_AMBULATORY_CARE_PROVIDER_SITE_OTHER): Payer: 59 | Admitting: Family Medicine

## 2022-07-20 VITALS — BP 114/76 | HR 98 | Ht 74.0 in | Wt 227.0 lb

## 2022-07-20 DIAGNOSIS — M1611 Unilateral primary osteoarthritis, right hip: Secondary | ICD-10-CM

## 2022-07-20 NOTE — Progress Notes (Signed)
Date:  07/20/2022   Name:  Cody [name]   DOB:  1970/03/19   MRN:  [id number]   Chief Complaint: Pre-op Exam (Total hip replacement Dr Audelia Acton)  Patient is a 52 year old male who presents for a preoerative evaluation exam. The patient reports the following problems: none. Health maintenance has been reviewed up to date.      Lab Results  Component Value Date   NA 141 06/22/2022   K 4.5 06/22/2022   CO2 22 06/22/2022   GLUCOSE 97 06/22/2022   BUN 15 06/22/2022   CREATININE 1.24 06/22/2022   CALCIUM 9.6 06/22/2022   EGFR 69 06/22/2022   GFRNONAA 63 10/31/2017   Lab Results  Component Value Date   CHOL 264 (H) 06/22/2022   HDL 48 06/22/2022   LDLCALC 203 (H) 06/22/2022   TRIG 80 06/22/2022   CHOLHDL 3.7 05/18/2017   Lab Results  Component Value Date   TSH 2.720 10/31/2017   No results found for: "HGBA1C" Lab Results  Component Value Date   WBC 5.2 10/31/2017   HGB 15.7 10/31/2017   HCT 45.9 10/31/2017   MCV 87 10/31/2017   PLT 300 10/31/2017   Lab Results  Component Value Date   ALT 23 06/22/2022   AST 25 06/22/2022   ALKPHOS 63 06/22/2022   BILITOT 0.5 06/22/2022   No results found for: "25OHVITD2", "25OHVITD3", "VD25OH"   Review of Systems  Constitutional:  Negative for chills and fever.  HENT:  Negative for drooling, ear discharge, ear pain and sore throat.   Respiratory:  Negative for cough, shortness of breath and wheezing.   Cardiovascular:  Negative for chest pain, palpitations and leg swelling.  Gastrointestinal:  Negative for abdominal pain, blood in stool, constipation, diarrhea and nausea.  Endocrine: Negative for polydipsia.  Genitourinary:  Negative for dysuria, frequency, hematuria and urgency.  Musculoskeletal:  Negative for back pain, myalgias and neck pain.  Skin:  Negative for rash.  Allergic/Immunologic: Negative for environmental allergies.  Neurological:  Negative for dizziness and headaches.  Hematological:  Does not  bruise/bleed easily.  Psychiatric/Behavioral:  Negative for suicidal ideas. The patient is not nervous/anxious.     Patient Active Problem List   Diagnosis Date Noted   Chronic right SI joint pain 05/09/2021   History of lumbar laminectomy 10/25/2020   Chronic radicular lumbar pain 10/25/2020   Neuropathic pain 10/25/2020   Lumbar facet arthropathy 10/25/2020   Chronic pain syndrome 10/25/2020   Piriformis syndrome, left 10/05/2020   Sacroiliac joint dysfunction of left side 10/04/2020   Back pain of lumbosacral region with sciatica 10/04/2020   Greater trochanteric bursitis, right 10/04/2020   Primary osteoarthritis of right hip 07/06/2020   Gluteal tendinitis, right hip 06/22/2020   Onychomycosis 09/12/2015   CN (constipation) 01/02/2014    No Known Allergies  Past Surgical History:  Procedure Laterality Date   APPENDECTOMY  2005   incidental at time of colon surgery.   BACK SURGERY  2000   COLON SURGERY  2005   Left colectomy for redundant colon with severe constipation.   COLONOSCOPY     COLONOSCOPY WITH PROPOFOL N/A 06/09/2020   Procedure: COLONOSCOPY WITH PROPOFOL;  Surgeon: Earline Mayotte, MD;  Location: [facility] ENDOSCOPY;  Service: Endoscopy;  Laterality: N/A;   HERNIA REPAIR  12/16/2003   ventral hernia repair with Compsix Kugel mesh    Social History   Tobacco Use   Smoking status: Never   Smokeless tobacco: Never  Vaping Use  Vaping Use: Never used  Substance Use Topics   Alcohol use: Not Currently    Alcohol/week: 0.0 standard drinks of alcohol   Drug use: Never     Medication list has been reviewed and updated.  Current Meds  Medication Sig   lisinopril (ZESTRIL) 5 MG tablet Take 1 tablet (5 mg total) by mouth daily.   traZODone (DESYREL) 50 MG tablet Take 0.5-1 tablets (25-50 mg total) by mouth at bedtime as needed for sleep.       07/20/2022    1:34 PM 06/22/2022    9:45 AM 05/09/2022    9:46 AM 05/09/2021   11:12 AM  GAD 7 : Generalized  Anxiety Score  Nervous, Anxious, on Edge 0 0 0 0  Control/stop worrying 0 0 0 0  Worry too much - different things 0 0 0 0  Trouble relaxing 0 0 0 0  Restless 0 0 0 0  Easily annoyed or irritable 0 0 0 0  Afraid - awful might happen 0 0 0 0  Total GAD 7 Score 0 0 0 0  Anxiety Difficulty Not difficult at all Not difficult at all Not difficult at all        07/20/2022    1:34 PM 06/22/2022    9:44 AM 05/09/2022    9:46 AM  Depression screen PHQ 2/9  Decreased Interest 0 0 0  Down, Depressed, Hopeless 0 0 0  PHQ - 2 Score 0 0 0  Altered sleeping 0 0 0  Tired, decreased energy 0 0 0  Change in appetite 0 0 0  Feeling bad or failure about yourself  0 0 0  Trouble concentrating 0 0 0  Moving slowly or fidgety/restless 0 0 0  Suicidal thoughts 0 0 0  PHQ-9 Score 0 0 0  Difficult doing work/chores Not difficult at all Not difficult at all Not difficult at all    BP Readings from Last 3 Encounters:  07/20/22 114/76  06/22/22 124/78  05/09/22 (!) 122/98    Physical Exam Vitals and nursing note reviewed.  HENT:     Head: Normocephalic.     Right Ear: Tympanic membrane and external ear normal.     Left Ear: Tympanic membrane and external ear normal.     Nose: Nose normal.     Mouth/Throat:     Mouth: Mucous membranes are moist.  Eyes:     General: No scleral icterus.       Right eye: No discharge.        Left eye: No discharge.     Conjunctiva/sclera: Conjunctivae normal.     Pupils: Pupils are equal, round, and reactive to light.  Neck:     Thyroid: No thyromegaly.     Vascular: No JVD.     Trachea: No tracheal deviation.  Cardiovascular:     Rate and Rhythm: Normal rate and regular rhythm.     Heart sounds: Normal heart sounds. No murmur heard.    No friction rub. No gallop.  Pulmonary:     Effort: No respiratory distress.     Breath sounds: Normal breath sounds. No wheezing, rhonchi or rales.  Abdominal:     General: Bowel sounds are normal.     Palpations:  Abdomen is soft. There is no mass.     Tenderness: There is no abdominal tenderness. There is no guarding or rebound.  Musculoskeletal:        General: No tenderness.     Cervical back: Normal  range of motion and neck supple.  Lymphadenopathy:     Cervical: No cervical adenopathy.  Skin:    General: Skin is warm.     Findings: No rash.  Neurological:     Mental Status: He is alert.     Deep Tendon Reflexes: Reflexes are normal and symmetric.     Wt Readings from Last 3 Encounters:  07/20/22 227 lb (103 kg)  06/22/22 227 lb (103 kg)  05/09/22 226 lb (102.5 kg)    BP 114/76   Pulse 98   Ht 6\' 2"  (1.88 m)   Wt 227 lb (103 kg)   SpO2 98%   BMI 29.15 kg/m   Assessment and Plan:  1. Primary osteoarthritis of right hip Patient in preparation for upcoming surgery and preoperative evaluation prior to patient's total hip replacement.  Examination and history reveals no contraindications to surgery and we will proceed and patient has been given written verification of clearance for surgery.   Elizabeth Sauer, MD

## 2022-08-07 ENCOUNTER — Other Ambulatory Visit: Payer: Self-pay

## 2022-08-14 ENCOUNTER — Other Ambulatory Visit: Payer: Self-pay | Admitting: Orthopedic Surgery

## 2022-08-23 ENCOUNTER — Other Ambulatory Visit: Payer: Self-pay | Admitting: Orthopedic Surgery

## 2022-08-23 ENCOUNTER — Encounter
Admission: RE | Admit: 2022-08-23 | Discharge: 2022-08-23 | Disposition: A | Discharge status: Home / Self Care | Payer: 59 | Source: Ambulatory Visit | Attending: Orthopedic Surgery | Admitting: Orthopedic Surgery

## 2022-08-23 VITALS — BP 120/94 | HR 87 | Resp 16 | Wt 223.1 lb

## 2022-08-23 DIAGNOSIS — Z01818 Encounter for other preprocedural examination: Secondary | ICD-10-CM | POA: Diagnosis present

## 2022-08-23 DIAGNOSIS — Z01812 Encounter for preprocedural laboratory examination: Secondary | ICD-10-CM

## 2022-08-23 DIAGNOSIS — Z01810 Encounter for preprocedural cardiovascular examination: Secondary | ICD-10-CM | POA: Diagnosis not present

## 2022-08-23 HISTORY — DX: Anemia, unspecified: D64.9

## 2022-08-23 HISTORY — DX: Unspecified osteoarthritis, unspecified site: M19.90

## 2022-08-23 LAB — CBC WITH DIFFERENTIAL/PLATELET
Abs Immature Granulocytes: 0.02 10*3/uL (ref 0.00–0.07)
Basophils Absolute: 0 10*3/uL (ref 0.0–0.1)
Basophils Relative: 1 %
Eosinophils Absolute: 0.1 10*3/uL (ref 0.0–0.5)
Eosinophils Relative: 2 %
HCT: 44.7 % (ref 39.0–52.0)
Hemoglobin: 15.1 g/dL (ref 13.0–17.0)
Immature Granulocytes: 0 %
Lymphocytes Relative: 30 %
Lymphs Abs: 1.6 10*3/uL (ref 0.7–4.0)
MCH: 30.8 pg (ref 26.0–34.0)
MCHC: 33.8 g/dL (ref 30.0–36.0)
MCV: 91 fL (ref 80.0–100.0)
Monocytes Absolute: 0.4 10*3/uL (ref 0.1–1.0)
Monocytes Relative: 8 %
Neutro Abs: 3.2 10*3/uL (ref 1.7–7.7)
Neutrophils Relative %: 59 %
Platelets: 318 10*3/uL (ref 150–400)
RBC: 4.91 MIL/uL (ref 4.22–5.81)
RDW: 13.2 % (ref 11.5–15.5)
WBC: 5.4 10*3/uL (ref 4.0–10.5)
nRBC: 0 % (ref 0.0–0.2)

## 2022-08-23 LAB — COMPREHENSIVE METABOLIC PANEL
ALT: 20 U/L (ref 0–44)
AST: 24 U/L (ref 15–41)
Albumin: 4.7 g/dL (ref 3.5–5.0)
Alkaline Phosphatase: 49 U/L (ref 38–126)
Anion gap: 9 (ref 5–15)
BUN: 16 mg/dL (ref 6–20)
CO2: 26 mmol/L (ref 22–32)
Calcium: 9.4 mg/dL (ref 8.9–10.3)
Chloride: 104 mmol/L (ref 98–111)
Creatinine, Ser: 1.21 mg/dL (ref 0.61–1.24)
GFR, Estimated: >60 mL/min (ref >60)
Glucose, Bld: 94 mg/dL (ref 70–99)
Potassium: 3.8 mmol/L (ref 3.5–5.1)
Sodium: 139 mmol/L (ref 135–145)
Total Bilirubin: 0.9 mg/dL (ref 0.3–1.2)
Total Protein: 7.6 g/dL (ref 6.5–8.1)

## 2022-08-23 LAB — TYPE AND SCREEN
ABO/RH(D): O POS
Antibody Screen: NEGATIVE

## 2022-08-23 LAB — URINALYSIS, ROUTINE W REFLEX MICROSCOPIC
Bilirubin Urine: NEGATIVE
Glucose, UA: NEGATIVE mg/dL
Hgb urine dipstick: NEGATIVE
Ketones, ur: NEGATIVE mg/dL
Leukocytes,Ua: NEGATIVE
Nitrite: NEGATIVE
Protein, ur: NEGATIVE mg/dL
Specific Gravity, Urine: 1.019 (ref 1.005–1.030)
pH: 5 (ref 5.0–8.0)

## 2022-08-23 LAB — SURGICAL PCR SCREEN
MRSA, PCR: NEGATIVE
Staphylococcus aureus: POSITIVE — AB

## 2022-08-23 NOTE — Patient Instructions (Addendum)
Your procedure is scheduled on: 09/04/22 - Monday Report to the Registration Desk on the 1st floor of the [facility]. To find out your arrival time, please call [phone] between 1PM - 3PM on: 09/01/22 - Friday If your arrival time is 6:00 am, do not arrive before that time as the [facility] entrance doors do not open until 6:00 am.  REMEMBER: Instructions that are not followed completely may result in serious medical risk, up to and including death; or upon the discretion of your surgeon and anesthesiologist your surgery may need to be rescheduled.  Do not eat food after midnight the night before surgery.  No gum chewing or hard candies.  You may however, drink CLEAR liquids up to 2 hours before you are scheduled to arrive for your surgery. Do not drink anything within 2 hours of your scheduled arrival time.  Clear liquids include: - water  - apple juice without pulp - gatorade (not RED colors) - black coffee or tea (Do NOT add milk or creamers to the coffee or tea) Do NOT drink anything that is not on this list.  In addition, your doctor has ordered for you to drink the provided:  Ensure Pre-Surgery Clear Carbohydrate Drink  Drinking this carbohydrate drink up to two hours before surgery helps to reduce insulin resistance and improve patient outcomes. Please complete drinking 2 hours before scheduled arrival time.  One week prior to surgery: Stop Anti-inflammatories (NSAIDS) such as Advil, Aleve, Ibuprofen, Motrin, Naproxen, Naprosyn and Aspirin based products such as Excedrin, Goody's Powder, BC Powder.  Stop ANY OVER THE COUNTER supplements until after surgery.  You may take Tylenol if needed for pain up until the day of surgery.   TAKE ONLY THESE MEDICATIONS THE MORNING OF SURGERY WITH A SIP OF WATER:  NONE  No Alcohol for 24 hours before or after surgery.  No Smoking including e-cigarettes for 24 hours before surgery.  No chewable tobacco products for at least 6  hours before surgery.  No nicotine patches on the day of surgery.  Do not use any "recreational" drugs for at least a week (preferably 2 weeks) before your surgery.  Please be advised that the combination of cocaine and anesthesia may have negative outcomes, up to and including death. If you test positive for cocaine, your surgery will be cancelled.  On the morning of surgery brush your teeth with toothpaste and water, you may rinse your mouth with mouthwash if you wish. Do not swallow any toothpaste or mouthwash.  Use CHG Soap or wipes as directed on instruction sheet.  Do not wear jewelry, make-up, hairpins, clips or nail polish.  Do not wear lotions, powders, or perfumes.   Do not shave body hair from the neck down 48 hours before surgery.  Contact lenses, hearing aids and dentures may not be worn into surgery.  Do not bring valuables to the hospital. [facility] is not responsible for any missing/lost belongings or valuables.   Notify your doctor if there is any change in your medical condition (cold, fever, infection).  Wear comfortable clothing (specific to your surgery type) to the hospital.  After surgery, you can help prevent lung complications by doing breathing exercises.  Take deep breaths and cough every 1-2 hours. Your doctor may order a device called an Incentive Spirometer to help you take deep breaths. When coughing or sneezing, hold a pillow firmly against your incision with both hands. This is called "splinting." Doing this helps protect your incision. It  also decreases belly discomfort.  If you are being admitted to the hospital overnight, leave your suitcase in the car. After surgery it may be brought to your room.  In case of increased patient census, it may be necessary for you, the patient, to continue your postoperative care in the Same Day Surgery department.  If you are being discharged the day of surgery, you will not be allowed to drive home. You will  need a responsible individual to drive you home and stay with you for 24 hours after surgery.   If you are taking public transportation, you will need to have a responsible individual with you.  Please call the Pre-admissions Testing Dept. at [phone] if you have any questions about these instructions.  Surgery Visitation Policy:  Patients having surgery or a procedure may have two visitors.  Children under the age of 87 must have an adult with them who is not the patient.  Inpatient Visitation:    Visiting hours are 7 a.m. to 8 p.m. Up to four visitors are allowed at one time in a patient room. The visitors may rotate out with other people during the day.  One visitor age 15 or older may stay with the patient overnight and must be in the room by 8 p.m.    Pre-operative 5 CHG Bath Instructions   You can play a key role in reducing the risk of infection after surgery. Your skin needs to be as free of germs as possible. You can reduce the number of germs on your skin by washing with CHG (chlorhexidine gluconate) soap before surgery. CHG is an antiseptic soap that kills germs and continues to kill germs even after washing.   DO NOT use if you have an allergy to chlorhexidine/CHG or antibacterial soaps. If your skin becomes reddened or irritated, stop using the CHG and notify one of our RNs at [phone].   Please shower with the CHG soap starting 4 days before surgery using the following schedule: 08/31/22 - 09/04/22.    Please keep in mind the following:  DO NOT shave, including legs and underarms, starting the day of your first shower.   You may shave your face at any point before/day of surgery.  Place clean sheets on your bed the day you start using CHG soap. Use a clean washcloth (not used since being washed) for each shower. DO NOT sleep with pets once you start using the CHG.   CHG Shower Instructions:  If you choose to wash your hair and private area, wash first with  your normal shampoo/soap.  After you use shampoo/soap, rinse your hair and body thoroughly to remove shampoo/soap residue.  Turn the water OFF and apply about 3 tablespoons (45 ml) of CHG soap to a CLEAN washcloth.  Apply CHG soap ONLY FROM YOUR NECK DOWN TO YOUR TOES (washing for 3-5 minutes)  DO NOT use CHG soap on face, private areas, open wounds, or sores.  Pay special attention to the area where your surgery is being performed.  If you are having back surgery, having someone wash your back for you may be helpful. Wait 2 minutes after CHG soap is applied, then you may rinse off the CHG soap.  Pat dry with a clean towel  Put on clean clothes/pajamas   If you choose to wear lotion, please use ONLY the CHG-compatible lotions on the back of this paper.     Additional instructions for the day of surgery: DO NOT APPLY any  lotions, deodorants, cologne, or perfumes.   Put on clean/comfortable clothes.  Brush your teeth.  Ask your nurse before applying any prescription medications to the skin.      CHG Compatible Lotions   Aveeno Moisturizing lotion  Cetaphil Moisturizing Cream  Cetaphil Moisturizing Lotion  Clairol Herbal Essence Moisturizing Lotion, Dry Skin  Clairol Herbal Essence Moisturizing Lotion, Extra Dry Skin  Clairol Herbal Essence Moisturizing Lotion, Normal Skin  Curel Age Defying Therapeutic Moisturizing Lotion with Alpha Hydroxy  Curel Extreme Care Body Lotion  Curel Soothing Hands Moisturizing Hand Lotion  Curel Therapeutic Moisturizing Cream, Fragrance-Free  Curel Therapeutic Moisturizing Lotion, Fragrance-Free  Curel Therapeutic Moisturizing Lotion, Original Formula  Eucerin Daily Replenishing Lotion  Eucerin Dry Skin Therapy Plus Alpha Hydroxy Crme  Eucerin Dry Skin Therapy Plus Alpha Hydroxy Lotion  Eucerin Original Crme  Eucerin Original Lotion  Eucerin Plus Crme Eucerin Plus Lotion  Eucerin TriLipid Replenishing Lotion  Keri Anti-Bacterial Hand Lotion   Keri Deep Conditioning Original Lotion Dry Skin Formula Softly Scented  Keri Deep Conditioning Original Lotion, Fragrance Free Sensitive Skin Formula  Keri Lotion Fast Absorbing Fragrance Free Sensitive Skin Formula  Keri Lotion Fast Absorbing Softly Scented Dry Skin Formula  Keri Original Lotion  Keri Skin Renewal Lotion Keri Silky Smooth Lotion  Keri Silky Smooth Sensitive Skin Lotion  Nivea Body Creamy Conditioning Oil  Nivea Body Extra Enriched Lotion  Nivea Body Original Lotion  Nivea Body Sheer Moisturizing Lotion Nivea Crme  Nivea Skin Firming Lotion  NutraDerm 30 Skin Lotion  NutraDerm Skin Lotion  NutraDerm Therapeutic Skin Cream  NutraDerm Therapeutic Skin Lotion  ProShield Protective Hand Cream  Provon moisturizing lotion Preoperative Educational Videos for Total Hip, Knee and Shoulder Replacements  To better prepare for surgery, please view our videos that explain the physical activity and discharge planning required to have the best surgical recovery at [facility].  [email or web address]  Questions? Call [phone] or email [email or web address]

## 2022-08-24 ENCOUNTER — Telehealth: Payer: Self-pay

## 2022-08-24 ENCOUNTER — Telehealth: Payer: Self-pay | Admitting: Urgent Care

## 2022-08-24 DIAGNOSIS — R9431 Abnormal electrocardiogram [ECG] [EKG]: Secondary | ICD-10-CM

## 2022-08-24 DIAGNOSIS — Z01810 Encounter for preprocedural cardiovascular examination: Secondary | ICD-10-CM

## 2022-08-24 NOTE — Progress Notes (Addendum)
  Perioperative Services Pre-Admission/Anesthesia Testing    Date: 08/24/22  Name: Cody [name] MRN:   [id number]  Re: Abnormal ECG and need for further evaluation prior to elective RIGHT THA  Clinical Notes:  Patient is scheduled for an elective RIGHT total hip arthroplasty on 09/04/2022 with Dr. Reinaldo Berber, MD. In preparation for his procedure, patient presented to the PAT clinic on 08/23/2022 for preoperative labs and ECG.  In review of his ECG, patient with noted ST and T wave changes in leads II, III, aVF, and V3-V6.  Voltage criteria for LVH met.  Patient has no previous tracings for review in [facility].  Query LVH pattern versus ischemia.  Reached out to patient's PCP Yetta Barre, MD) to review ECG and to determine whether or not there were previous tracings available in her office.  Received return communication from MD today (08/24/2022).  I had previously inquired whether or not PCP felt like changes or new and concerning enough to have patient seen in consult by cardiology. I was advised that MD had reviewed previous ECGs available to her and noted changes.  MD would like for patient to be seen in consult by cardiology prior to elective surgery.  Patient denied any known history of previous cardiovascular problems.  He does have controlled hypertension that is being treated with ACEi (lisinopril) monotherapy.  Patient does not smoke, nor does he utilize alcohol or other illegal substances.   ECG:    Impression and Plan:  Cody [name] is scheduled for upcoming elective orthopedic surgery on 09/04/2022.  Preoperative ECG revealed ST and T wave abnormalities; no priors for comparison.  Reviewed tracing with medicine who advised that, as a precaution, she would like for patient to be seen by cardiology for evaluation and clearance prior to upcoming surgery.  Referral sent to [facility] today.  PCP's office attempted to contact patient, however they were unable to make contact with  him. I sent patient a message through MyChart to make him aware of the aforementioned.   No changes are being made to the OR schedule at this time. I did inform patient that, based on cardiology availability, we may have to move his surgery date. This decision will follow provider evaluation and completion of any testing that is deemed to be necessary. Note being sent to patient's primary attending surgeon to make them aware. I will plan on following up with cardiology regarding appointment and ultimate disposition regarding his upcoming surgery.   Quentin Mulling, MSN, APRN, FNP-C, CEN [facility]  Peri-operative Services Nurse Practitioner Phone: [phone] 08/24/22 9:02 AM  NOTE: This note has been prepared using Dragon dictation software. Despite my best ability to proofread, there is always the potential that unintentional transcriptional errors may still occur from this process.

## 2022-08-24 NOTE — Telephone Encounter (Signed)
Preop clearance came in by fax on pt with a questionable EKG. I reached out to Quentin Mulling NP after receiving the fax from him and let him know Yetta Barre feels it will be best for cardio to clear him. Bryan responded by chat with his staff will reach out to the pt as well as cardio to get appt. I left vm on preadmit's vm stating pt will need cardiac eval prior to surg

## 2022-08-29 ENCOUNTER — Encounter: Payer: Self-pay | Admitting: Cardiology

## 2022-08-29 ENCOUNTER — Ambulatory Visit: Payer: 59 | Attending: Cardiology | Admitting: Cardiology

## 2022-08-29 ENCOUNTER — Encounter: Payer: Self-pay | Admitting: Orthopedic Surgery

## 2022-08-29 VITALS — BP 110/88 | HR 78 | Ht 74.0 in | Wt 219.8 lb

## 2022-08-29 DIAGNOSIS — I10 Essential (primary) hypertension: Secondary | ICD-10-CM | POA: Diagnosis not present

## 2022-08-29 DIAGNOSIS — R9431 Abnormal electrocardiogram [ECG] [EKG]: Secondary | ICD-10-CM | POA: Diagnosis not present

## 2022-08-29 DIAGNOSIS — Z01810 Encounter for preprocedural cardiovascular examination: Secondary | ICD-10-CM

## 2022-08-29 NOTE — Progress Notes (Signed)
  Perioperative Services Pre-Admission/Anesthesia Testing    Date: 08/29/22  Name: Cody [name] MRN:   [id number]  Re: Clearance for surgery  Planned Surgical Procedure(s):    Case: [id number] Date/Time: 09/04/22 1011   Procedure: TOTAL HIP ARTHROPLASTY ANTERIOR APPROACH (Right: Hip)   Anesthesia type: Choice   Pre-op diagnosis:      Primary osteoarthritis of right hip M16.11     Right hip pain M25.551   Location: [facility] OR ROOM 02 / [facility]S FOR ANEST[facility]   Surgeons: Reinaldo Berber, MD      Clinical Notes:  Patient is scheduled for the above procedure on 09/04/2022 with Dr. Reinaldo Berber, MD. in preparation for his procedure, patient presented to the PAT clinic on 08/23/2022 for preoperative interview and testing.  Preoperative ECG revealed ST and T wave changes.  There were no previous ECGs for review.  Tracing reviewed with medicine Yetta Barre, MD) who asked the patient be referred to cardiology for further evaluation prior to elective orthopedic surgery.  Patient was seen and consult by cardiology Azucena Cecil, MD) today (08/29/2022); notes reviewed.  At the time of this clinic visit, patient doing well overall from a cardiovascular perspective.  He denied any chest pain, shortness breath, PND, orthopnea, palpitations, significant peripheral edema, vertiginous symptoms, or presyncope/syncope.  Patient with no known cardiovascular diagnoses. Nonspecific ECG changes were appreciated on preoperative ECG.  ECG was repeated today by cardiologist with improvement noted.  Given changes, MD electing to further evaluate via TTE.  In light of the fact that the patient was asymptomatic, cardiology cleared patient to proceed with elective orthopedic procedure at an overall ACCEPTABLE risk.  TTE does not have to be done prior to upcoming elective surgical procedure per cardiology.  No changes were made to his medication regimen.  Patient follow-up with outpatient cardiology in 3 months or sooner  if needed.  Quentin Mulling, MSN, APRN, FNP-C, CEN [facility]  Peri-operative Services Nurse Practitioner Phone: [phone] 08/29/22 2:26 PM  NOTE: This note has been prepared using Dragon dictation software. Despite my best ability to proofread, there is always the potential that unintentional transcriptional errors may still occur from this process.

## 2022-08-29 NOTE — Progress Notes (Signed)
Cardiology Office Note:    Date:  08/29/2022   ID:  Cody [name], DOB 1970/04/24, MRN [id number]  PCP:  Duanne Limerick, MD   [facility] Providers Cardiologist:  Debbe Odea, MD     Referring MD: Verlee Monte, NP   Chief Complaint  Patient presents with   New Patient (Initial Visit)    Abnormal EKG on recent pre-op clearance with no cardiac history.  Hip replacement has been r/s for 09/03/12 (pending cardiac clearance)   Cody [name] is a 52 y.o. male who is being seen today for the evaluation of cardiac risk at the request of Verlee Monte, NP.   History of Present Illness:    Cody [name] is a 52 y.o. male with a hx of hypertension presenting for preop evaluation.  Patient states having right hip arthritis, right hip pain.  Was told he has bone-on-bone contact in the right hip and needing surgery.  Patient is scheduled for right total hip arthroplasty 09/04/2022.  Preop EKG 08/23/2022 showed nonspecific inferior ST-T changes.  He denies any history of heart disease.  Denies any symptoms of chest pain, shortness of breath, palpitations.  He feels well, mobility limited by right hip pain.  Past Medical History:  Diagnosis Date   Anemia    Arthritis    Hypertension     Past Surgical History:  Procedure Laterality Date   APPENDECTOMY  2005   incidental at time of colon surgery.   BACK SURGERY  2000   COLON SURGERY  2005   Left colectomy for redundant colon with severe constipation.   COLONOSCOPY     COLONOSCOPY WITH PROPOFOL N/A 06/09/2020   Procedure: COLONOSCOPY WITH PROPOFOL;  Surgeon: Earline Mayotte, MD;  Location: [facility] ENDOSCOPY;  Service: Endoscopy;  Laterality: N/A;   HERNIA REPAIR  12/16/2003   ventral hernia repair with Compsix Kugel mesh    Current Medications: Current Meds  Medication Sig   Aspirin-Acetaminophen (GOODYS BACK & BODY PAIN PO) Take 1 Dose by mouth daily. qhs   diphenhydrAMINE (BENADRYL) 25 MG tablet Take 25 mg by mouth at  bedtime as needed. Takes 4 tablets qhs   lisinopril (ZESTRIL) 5 MG tablet Take 1 tablet (5 mg total) by mouth daily.   Multiple Vitamin (MULTIVITAMIN) capsule Take 1 capsule by mouth daily.   traZODone (DESYREL) 50 MG tablet Take 0.5-1 tablets (25-50 mg total) by mouth at bedtime as needed for sleep.     Allergies:   Patient has no known allergies.   Social History   Socioeconomic History   Marital status: Significant Other    Spouse name: Doni   Number of children: Not on file   Years of education: Not on file   Highest education level: Associate degree: occupational, Scientist, product/process development, or vocational program  Occupational History   Not on file  Tobacco Use   Smoking status: Never   Smokeless tobacco: Never  Vaping Use   Vaping Use: Never used  Substance and Sexual Activity   Alcohol use: Not Currently    Alcohol/week: 0.0 standard drinks of alcohol   Drug use: Never   Sexual activity: Yes    Partners: Female  Other Topics Concern   Not on file  Social History Narrative   Not on file   Social Determinants of Health   Financial Resource Strain: Low Risk  (06/18/2022)   Overall Financial Resource Strain (CARDIA)    Difficulty of Paying Living Expenses: Not very hard  Food  Insecurity: No Food Insecurity (06/18/2022)   Hunger Vital Sign    Worried About Running Out of Food in the Last Year: Never true    Ran Out of Food in the Last Year: Never true  Transportation Needs: No Transportation Needs (06/18/2022)   PRAPARE - Administrator, Civil Service (Medical): No    Lack of Transportation (Non-Medical): No  Physical Activity: Insufficiently Active (06/18/2022)   Exercise Vital Sign    Days of Exercise per Week: 1 day    Minutes of Exercise per Session: 10 min  Stress: No Stress Concern Present (06/18/2022)   [employer] of Occupational Health - Occupational Stress Questionnaire    Feeling of Stress : Only a little  Social Connections: Socially Isolated (06/18/2022)    Social Connection and Isolation Panel [NHANES]    Frequency of Communication with Friends and Family: Once a week    Frequency of Social Gatherings with Friends and Family: Once a week    Attends Religious Services: More than 4 times per year    Active Member of [employer] or Organizations: No    Attends Engineer, structural: Not on file    Marital Status: Divorced     Family History: The patient's family history includes Heart disease in his father and paternal grandfather.  ROS:   Please see the history of present illness.     All other systems reviewed and are negative.  EKGs/Labs/Other Studies Reviewed:    The following studies were reviewed today:  EKG Interpretation Date/Time:  Tuesday August 29 2022 09:15:39 EDT Ventricular Rate:  78 PR Interval:  134 QRS Duration:  96 QT Interval:  394 QTC Calculation: 449 R Axis:   44  Text Interpretation: Normal sinus rhythm with sinus arrhythmia Nonspecific T wave abnormality Confirmed by Debbe Odea ([id number]) on 08/29/2022 9:28:33 AM    Recent Labs: 08/23/2022: ALT 20; BUN 16; Creatinine, Ser 1.21; Hemoglobin 15.1; Platelets 318; Potassium 3.8; Sodium 139  Recent Lipid Panel    Component Value Date/Time   CHOL 264 (H) 06/22/2022 1038   TRIG 80 06/22/2022 1038   HDL 48 06/22/2022 1038   CHOLHDL 3.7 05/18/2017 0918   LDLCALC 203 (H) 06/22/2022 1038     Risk Assessment/Calculations:               Physical Exam:    VS:  BP 110/88 (BP Location: Left Arm, Patient Position: Sitting, Cuff Size: Normal)   Pulse 78   Ht 6\' 2"  (1.88 m)   Wt 219 lb 12.8 oz (99.7 kg)   SpO2 97%   BMI 28.22 kg/m     Wt Readings from Last 3 Encounters:  08/29/22 219 lb 12.8 oz (99.7 kg)  08/23/22 223 lb 1.7 oz (101.2 kg)  07/20/22 227 lb (103 kg)     GEN:  Well nourished, well developed in no acute distress HEENT: Normal NECK: No JVD; No carotid bruits CARDIAC: RRR, no murmurs, rubs, gallops RESPIRATORY:  Clear to  auscultation without rales, wheezing or rhonchi  ABDOMEN: Soft, non-tender, non-distended MUSCULOSKELETAL:  No edema; No deformity  SKIN: Warm and dry NEUROLOGIC:  Alert and oriented x 3 PSYCHIATRIC:  Normal affect   ASSESSMENT:    1. Preop cardiovascular exam   2. EKG, abnormal   3. Primary hypertension    PLAN:    In order of problems listed above:  Preop evaluation, right hip arthroplasty being planned.  Patient denies any cardiac symptoms of chest pain, shortness of breath.  ST-T changes on EKG nonspecific.  Patient clinically asymptomatic.  Okay to proceed with procedure from a cardiac perspective, no additional testing indicated prior to surgery. Nonspecific T wave changes, management of risk factors including hypertension should be continued.  Low-salt diet, low-cholesterol diet emphasized.  BP adequately controlled.  Will obtain echo.  However, this should not delay patient's procedure. Hypertension, BP controlled, continue lisinopril 5 mg daily.  Follow-up in 3 months.      Medication Adjustments/Labs and Tests Ordered: Current medicines are reviewed at length with the patient today.  Concerns regarding medicines are outlined above.  Orders Placed This Encounter  Procedures   EKG 12-Lead   ECHOCARDIOGRAM COMPLETE   No orders of the defined types were placed in this encounter.   Patient Instructions  Medication Instructions:  Your physician recommends that you continue on your current medications as directed. Please refer to the Current Medication list given to you today.  *If you need a refill on your cardiac medications before your next appointment, please call your pharmacy*   Lab Work: -None ordered  Testing/Procedures: Your physician has requested that you have an echocardiogram. Echocardiography is a painless test that uses sound waves to create images of your heart. It provides your doctor with information about the size and shape of your heart and how well  your heart's chambers and valves are working. This procedure takes approximately one hour. There are no restrictions for this procedure. Please do NOT wear cologne, perfume, aftershave, or lotions (deodorant is allowed). Please arrive 15 minutes prior to your appointment time.    Follow-Up: At [facility], you and your health needs are our priority.  As part of our continuing mission to provide you with exceptional heart care, we have created designated Provider Care Teams.  These Care Teams include your primary Cardiologist (physician) and Advanced Practice Providers (APPs -  Physician Assistants and Nurse Practitioners) who all work together to provide you with the care you need, when you need it.  Your next appointment:   3 month(s)  Provider:   You may see Debbe Odea, MD or one of the following Advanced Practice Providers on your designated Care Team:   Nicolasa Ducking, NP Eula Listen, PA-C Cadence Fransico Michael, PA-C Charlsie Quest, NP    Other Instructions -None    Signed, Debbe Odea, MD  08/29/2022 9:40 AM    [facility]

## 2022-08-29 NOTE — Patient Instructions (Signed)
Medication Instructions:  Your physician recommends that you continue on your current medications as directed. Please refer to the Current Medication list given to you today.  *If you need a refill on your cardiac medications before your next appointment, please call your pharmacy*   Lab Work: -None ordered  Testing/Procedures: Your physician has requested that you have an echocardiogram. Echocardiography is a painless test that uses sound waves to create images of your heart. It provides your doctor with information about the size and shape of your heart and how well your heart's chambers and valves are working. This procedure takes approximately one hour. There are no restrictions for this procedure. Please do NOT wear cologne, perfume, aftershave, or lotions (deodorant is allowed). Please arrive 15 minutes prior to your appointment time.    Follow-Up: At [facility], you and your health needs are our priority.  As part of our continuing mission to provide you with exceptional heart care, we have created designated Provider Care Teams.  These Care Teams include your primary Cardiologist (physician) and Advanced Practice Providers (APPs -  Physician Assistants and Nurse Practitioners) who all work together to provide you with the care you need, when you need it.  Your next appointment:   3 month(s)  Provider:   You may see Debbe Odea, MD or one of the following Advanced Practice Providers on your designated Care Team:   Nicolasa Ducking, NP Eula Listen, PA-C Cadence Fransico Michael, PA-C Charlsie Quest, NP    Other Instructions -None

## 2022-08-30 DIAGNOSIS — M1611 Unilateral primary osteoarthritis, right hip: Secondary | ICD-10-CM | POA: Diagnosis not present

## 2022-09-03 MED ORDER — CHLORHEXIDINE GLUCONATE 0.12 % MT SOLN
15.0000 mL | Freq: Once | OROMUCOSAL | Status: AC
  Administered 2022-09-04: 15 mL via OROMUCOSAL

## 2022-09-03 MED ORDER — LACTATED RINGERS IV SOLN: INTRAVENOUS | Status: DC

## 2022-09-03 MED ORDER — FAMOTIDINE 20 MG PO TABS
20.0000 mg | ORAL_TABLET | Freq: Once | ORAL | Status: AC
  Administered 2022-09-04: 20 mg via ORAL

## 2022-09-03 MED ORDER — TRANEXAMIC ACID-NACL 1000-0.7 MG/100ML-% IV SOLN
1000.0000 mg | INTRAVENOUS | Status: AC
  Administered 2022-09-04: 1000 mg via INTRAVENOUS

## 2022-09-03 MED ORDER — ORAL CARE MOUTH RINSE: 15.0000 mL | Freq: Once | OROMUCOSAL | Status: AC

## 2022-09-03 MED ORDER — DEXAMETHASONE SODIUM PHOSPHATE 10 MG/ML IJ SOLN: 8.0000 mg | Freq: Once | INTRAMUSCULAR | Status: DC

## 2022-09-03 MED ORDER — CEFAZOLIN SODIUM-DEXTROSE 2-4 GM/100ML-% IV SOLN
2.0000 g | INTRAVENOUS | Status: AC
  Administered 2022-09-04: 2 g via INTRAVENOUS

## 2022-09-04 ENCOUNTER — Other Ambulatory Visit: Payer: Self-pay

## 2022-09-04 ENCOUNTER — Observation Stay
Admission: RE | Admit: 2022-09-04 | Discharge: 2022-09-05 | Disposition: A | Discharge status: Home Health | Payer: 59 | Attending: Orthopedic Surgery | Admitting: Orthopedic Surgery

## 2022-09-04 ENCOUNTER — Encounter: Payer: Self-pay | Admitting: Orthopedic Surgery

## 2022-09-04 ENCOUNTER — Encounter
Admission: RE | Disposition: A | Discharge status: Home Health | Payer: Self-pay | Source: Home / Self Care | Attending: Orthopedic Surgery

## 2022-09-04 ENCOUNTER — Ambulatory Visit: Payer: 59

## 2022-09-04 ENCOUNTER — Ambulatory Visit: Payer: 59 | Admitting: Urgent Care

## 2022-09-04 DIAGNOSIS — M1611 Unilateral primary osteoarthritis, right hip: Secondary | ICD-10-CM | POA: Diagnosis not present

## 2022-09-04 DIAGNOSIS — Z79899 Other long term (current) drug therapy: Secondary | ICD-10-CM | POA: Diagnosis not present

## 2022-09-04 DIAGNOSIS — M25551 Pain in right hip: Secondary | ICD-10-CM | POA: Diagnosis not present

## 2022-09-04 DIAGNOSIS — Z471 Aftercare following joint replacement surgery: Secondary | ICD-10-CM | POA: Diagnosis not present

## 2022-09-04 DIAGNOSIS — Z96641 Presence of right artificial hip joint: Secondary | ICD-10-CM | POA: Diagnosis not present

## 2022-09-04 DIAGNOSIS — I10 Essential (primary) hypertension: Secondary | ICD-10-CM | POA: Insufficient documentation

## 2022-09-04 HISTORY — PX: TOTAL HIP ARTHROPLASTY: SHX124

## 2022-09-04 HISTORY — DX: Ventral hernia without obstruction or gangrene: K43.9

## 2022-09-04 HISTORY — DX: Insomnia, unspecified: G47.00

## 2022-09-04 LAB — ABO/RH: ABO/RH(D): O POS

## 2022-09-04 SURGERY — ARTHROPLASTY, HIP, TOTAL, ANTERIOR APPROACH
Anesthesia: Spinal | Site: Hip | Laterality: Right

## 2022-09-04 MED ORDER — FAMOTIDINE 20 MG PO TABS
ORAL_TABLET | ORAL | Status: AC
  Filled 2022-09-04: qty 1

## 2022-09-04 MED ORDER — CEFAZOLIN SODIUM-DEXTROSE 2-4 GM/100ML-% IV SOLN
INTRAVENOUS | Status: AC
  Filled 2022-09-04: qty 100

## 2022-09-04 MED ORDER — MORPHINE SULFATE (PF) 4 MG/ML IV SOLN: 0.5000 mg | INTRAVENOUS | Status: DC | PRN

## 2022-09-04 MED ORDER — PANTOPRAZOLE SODIUM 40 MG PO TBEC
DELAYED_RELEASE_TABLET | ORAL | Status: AC
  Filled 2022-09-04: qty 1

## 2022-09-04 MED ORDER — TRAZODONE HCL 50 MG PO TABS: 25.0000 mg | ORAL_TABLET | Freq: Every evening | ORAL | Status: DC | PRN

## 2022-09-04 MED ORDER — EPHEDRINE 5 MG/ML INJ
INTRAVENOUS | Status: AC
  Filled 2022-09-04: qty 5

## 2022-09-04 MED ORDER — PROPOFOL 1000 MG/100ML IV EMUL
INTRAVENOUS | Status: AC
  Filled 2022-09-04: qty 100

## 2022-09-04 MED ORDER — DOCUSATE SODIUM 100 MG PO CAPS
ORAL_CAPSULE | ORAL | Status: AC
  Filled 2022-09-04: qty 1

## 2022-09-04 MED ORDER — METOCLOPRAMIDE HCL 5 MG PO TABS: 5.0000 mg | ORAL_TABLET | Freq: Three times a day (TID) | ORAL | Status: DC | PRN

## 2022-09-04 MED ORDER — TRANEXAMIC ACID-NACL 1000-0.7 MG/100ML-% IV SOLN
INTRAVENOUS | Status: AC
  Filled 2022-09-04: qty 100

## 2022-09-04 MED ORDER — BUPIVACAINE HCL (PF) 0.5 % IJ SOLN
INTRAMUSCULAR | Status: DC | PRN
  Administered 2022-09-04: 3 mL

## 2022-09-04 MED ORDER — CHLORHEXIDINE GLUCONATE 0.12 % MT SOLN
OROMUCOSAL | Status: AC
  Filled 2022-09-04: qty 15

## 2022-09-04 MED ORDER — EPHEDRINE SULFATE (PRESSORS) 50 MG/ML IJ SOLN
INTRAMUSCULAR | Status: DC | PRN
  Administered 2022-09-04: 10 mg via INTRAVENOUS

## 2022-09-04 MED ORDER — 0.9 % SODIUM CHLORIDE (POUR BTL) OPTIME
TOPICAL | Status: DC | PRN
  Administered 2022-09-04: 500 mL

## 2022-09-04 MED ORDER — ONDANSETRON HCL 4 MG PO TABS: 4.0000 mg | ORAL_TABLET | Freq: Four times a day (QID) | ORAL | Status: DC | PRN

## 2022-09-04 MED ORDER — MIDAZOLAM HCL 2 MG/2ML IJ SOLN
INTRAMUSCULAR | Status: AC
  Filled 2022-09-04: qty 2

## 2022-09-04 MED ORDER — KETOROLAC TROMETHAMINE 15 MG/ML IJ SOLN
15.0000 mg | Freq: Four times a day (QID) | INTRAMUSCULAR | Status: AC
  Administered 2022-09-04 – 2022-09-05 (×4): 15 mg via INTRAVENOUS

## 2022-09-04 MED ORDER — BUPIVACAINE HCL (PF) 0.5 % IJ SOLN
INTRAMUSCULAR | Status: AC
  Filled 2022-09-04: qty 10

## 2022-09-04 MED ORDER — LISINOPRIL 5 MG PO TABS
5.0000 mg | ORAL_TABLET | Freq: Every day | ORAL | Status: DC
  Administered 2022-09-05: 5 mg via ORAL

## 2022-09-04 MED ORDER — ENOXAPARIN SODIUM 40 MG/0.4ML IJ SOSY
40.0000 mg | PREFILLED_SYRINGE | INTRAMUSCULAR | Status: DC
  Administered 2022-09-05: 40 mg via SUBCUTANEOUS

## 2022-09-04 MED ORDER — SODIUM CHLORIDE 0.9 % IR SOLN
Status: DC | PRN
  Administered 2022-09-04: 500 mL

## 2022-09-04 MED ORDER — HYDROCODONE-ACETAMINOPHEN 5-325 MG PO TABS
ORAL_TABLET | ORAL | Status: AC
  Filled 2022-09-04: qty 2

## 2022-09-04 MED ORDER — DOCUSATE SODIUM 100 MG PO CAPS
100.0000 mg | ORAL_CAPSULE | Freq: Two times a day (BID) | ORAL | Status: DC
  Administered 2022-09-04 – 2022-09-05 (×3): 100 mg via ORAL

## 2022-09-04 MED ORDER — MENTHOL 3 MG MT LOZG: 1.0000 | LOZENGE | OROMUCOSAL | Status: DC | PRN

## 2022-09-04 MED ORDER — PANTOPRAZOLE SODIUM 40 MG PO TBEC
40.0000 mg | DELAYED_RELEASE_TABLET | Freq: Every day | ORAL | Status: DC
  Administered 2022-09-04 – 2022-09-05 (×2): 40 mg via ORAL

## 2022-09-04 MED ORDER — OXYCODONE HCL 5 MG/5ML PO SOLN: 5.0000 mg | Freq: Once | ORAL | Status: DC | PRN

## 2022-09-04 MED ORDER — BUPIVACAINE-EPINEPHRINE (PF) 0.25% -1:200000 IJ SOLN
INTRAMUSCULAR | Status: AC
  Filled 2022-09-04: qty 30

## 2022-09-04 MED ORDER — TRAMADOL HCL 50 MG PO TABS: 50.0000 mg | ORAL_TABLET | Freq: Four times a day (QID) | ORAL | Status: DC | PRN

## 2022-09-04 MED ORDER — BUPIVACAINE LIPOSOME 1.3 % IJ SUSP
INTRAMUSCULAR | Status: AC
  Filled 2022-09-04: qty 20

## 2022-09-04 MED ORDER — ACETAMINOPHEN 500 MG PO TABS
1000.0000 mg | ORAL_TABLET | Freq: Three times a day (TID) | ORAL | Status: DC
  Administered 2022-09-05: 1000 mg via ORAL

## 2022-09-04 MED ORDER — SODIUM CHLORIDE FLUSH 0.9 % IV SOLN
INTRAVENOUS | Status: AC
  Filled 2022-09-04: qty 10

## 2022-09-04 MED ORDER — PHENOL 1.4 % MT LIQD: 1.0000 | OROMUCOSAL | Status: DC | PRN

## 2022-09-04 MED ORDER — OXYCODONE HCL 5 MG PO TABS: 5.0000 mg | ORAL_TABLET | Freq: Once | ORAL | Status: DC | PRN

## 2022-09-04 MED ORDER — FENTANYL CITRATE (PF) 100 MCG/2ML IJ SOLN
INTRAMUSCULAR | Status: DC | PRN
  Administered 2022-09-04: 50 ug via INTRAVENOUS

## 2022-09-04 MED ORDER — KETOROLAC TROMETHAMINE 15 MG/ML IJ SOLN
INTRAMUSCULAR | Status: AC
  Filled 2022-09-04: qty 1

## 2022-09-04 MED ORDER — DEXAMETHASONE SODIUM PHOSPHATE 10 MG/ML IJ SOLN
INTRAMUSCULAR | Status: AC
  Filled 2022-09-04: qty 1

## 2022-09-04 MED ORDER — SURGIPHOR WOUND IRRIGATION SYSTEM - OPTIME: TOPICAL | Status: DC | PRN

## 2022-09-04 MED ORDER — CEFAZOLIN SODIUM-DEXTROSE 2-4 GM/100ML-% IV SOLN
2.0000 g | Freq: Four times a day (QID) | INTRAVENOUS | Status: AC
  Administered 2022-09-04 (×2): 2 g via INTRAVENOUS

## 2022-09-04 MED ORDER — FENTANYL CITRATE (PF) 100 MCG/2ML IJ SOLN: 25.0000 ug | INTRAMUSCULAR | Status: DC | PRN

## 2022-09-04 MED ORDER — PROPOFOL 500 MG/50ML IV EMUL
INTRAVENOUS | Status: DC | PRN
  Administered 2022-09-04: 75 ug/kg/min via INTRAVENOUS

## 2022-09-04 MED ORDER — MIDAZOLAM HCL 5 MG/5ML IJ SOLN
INTRAMUSCULAR | Status: DC | PRN
  Administered 2022-09-04: 2 mg via INTRAVENOUS

## 2022-09-04 MED ORDER — PHENYLEPHRINE HCL (PRESSORS) 10 MG/ML IV SOLN
INTRAVENOUS | Status: DC | PRN
  Administered 2022-09-04: 80 ug via INTRAVENOUS
  Administered 2022-09-04: 100 ug via INTRAVENOUS
  Administered 2022-09-04: 80 ug via INTRAVENOUS

## 2022-09-04 MED ORDER — FENTANYL CITRATE (PF) 100 MCG/2ML IJ SOLN
INTRAMUSCULAR | Status: AC
  Filled 2022-09-04: qty 2

## 2022-09-04 MED ORDER — TRANEXAMIC ACID 1000 MG/10ML IV SOLN
1000.0000 mg | Freq: Once | INTRAVENOUS | Status: AC
  Administered 2022-09-04: 1000 mg via INTRAVENOUS

## 2022-09-04 MED ORDER — METOCLOPRAMIDE HCL 5 MG/ML IJ SOLN: 5.0000 mg | Freq: Three times a day (TID) | INTRAMUSCULAR | Status: DC | PRN

## 2022-09-04 MED ORDER — HYDROCODONE-ACETAMINOPHEN 5-325 MG PO TABS
1.0000 | ORAL_TABLET | ORAL | Status: DC | PRN
  Administered 2022-09-04: 1 via ORAL
  Administered 2022-09-04: 2 via ORAL

## 2022-09-04 MED ORDER — SODIUM CHLORIDE 0.9 % IV SOLN: INTRAVENOUS | Status: DC

## 2022-09-04 MED ORDER — ONDANSETRON HCL 4 MG/2ML IJ SOLN: 4.0000 mg | Freq: Four times a day (QID) | INTRAMUSCULAR | Status: DC | PRN

## 2022-09-04 MED ORDER — PHENYLEPHRINE 80 MCG/ML (10ML) SYRINGE FOR IV PUSH (FOR BLOOD PRESSURE SUPPORT)
PREFILLED_SYRINGE | INTRAVENOUS | Status: AC
  Filled 2022-09-04: qty 10

## 2022-09-04 MED ORDER — HYDROCODONE-ACETAMINOPHEN 5-325 MG PO TABS
ORAL_TABLET | ORAL | Status: AC
  Filled 2022-09-04: qty 1

## 2022-09-04 MED ORDER — SODIUM CHLORIDE (PF) 0.9 % IJ SOLN
INTRAMUSCULAR | Status: DC | PRN
  Administered 2022-09-04: 50 mL

## 2022-09-04 SURGICAL SUPPLY — 70 items
ADH SKN CLS APL DERMABOND .7 (GAUZE/BANDAGES/DRESSINGS) ×1
AGENT HMST KT MTR STRL THRMB (HEMOSTASIS)
APL PRP STRL LF DISP 70% ISPRP (MISCELLANEOUS) ×1
BLADE SAGITTAL AGGR TOOTH XLG (BLADE) ×1 IMPLANT
BNDG CMPR 5X6 CHSV STRCH STRL (GAUZE/BANDAGES/DRESSINGS) ×1
BNDG COHESIVE 6X5 TAN ST LF (GAUZE/BANDAGES/DRESSINGS) ×1 IMPLANT
CHLORAPREP W/TINT 26 (MISCELLANEOUS) ×1 IMPLANT
COVER BACK TABLE REUSABLE LG (DRAPES) ×1 IMPLANT
DERMABOND ADVANCED .7 DNX12 (GAUZE/BANDAGES/DRESSINGS) ×1 IMPLANT
DRAPE 3/4 80X56 (DRAPES) ×1 IMPLANT
DRAPE C-ARM XRAY 36X54 (DRAPES) ×1 IMPLANT
DRAPE POUCH INSTRU U-SHP 10X18 (DRAPES) ×1 IMPLANT
DRSG MEPILEX SACRM 8.7X9.8 (GAUZE/BANDAGES/DRESSINGS) ×1 IMPLANT
DRSG OPSITE POSTOP 4X8 (GAUZE/BANDAGES/DRESSINGS) ×1 IMPLANT
ELECT BLADE 4.0 EZ CLEAN MEGAD (MISCELLANEOUS) ×1
ELECT REM PT RETURN 9FT ADLT (ELECTROSURGICAL) ×1
ELECTRODE BLDE 4.0 EZ CLN MEGD (MISCELLANEOUS) ×1 IMPLANT
ELECTRODE REM PT RTRN 9FT ADLT (ELECTROSURGICAL) ×1 IMPLANT
GLOVE BIO SURGEON STRL SZ8 (GLOVE) ×1 IMPLANT
GLOVE BIOGEL PI IND STRL 8 (GLOVE) ×1 IMPLANT
GLOVE PI ORTHO PRO STRL 7.5 (GLOVE) ×2 IMPLANT
GLOVE PI ORTHO PRO STRL SZ8 (GLOVE) ×2 IMPLANT
GLOVE SURG SYN 7.5 E (GLOVE) ×1 IMPLANT
GLOVE SURG SYN 7.5 PF PI (GLOVE) ×1 IMPLANT
GOWN SRG XL LVL 3 NONREINFORCE (GOWNS) ×1 IMPLANT
GOWN STRL NON-REIN TWL XL LVL3 (GOWNS) ×1
GOWN STRL REUS W/ TWL LRG LVL3 (GOWN DISPOSABLE) ×1 IMPLANT
GOWN STRL REUS W/ TWL XL LVL3 (GOWN DISPOSABLE) ×1 IMPLANT
GOWN STRL REUS W/TWL LRG LVL3 (GOWN DISPOSABLE) ×1
GOWN STRL REUS W/TWL XL LVL3 (GOWN DISPOSABLE) ×1
HANDLE YANKAUER SUCT OPEN TIP (MISCELLANEOUS) ×1 IMPLANT
HEAD CERAMIC FEMORAL 36MM (Head) IMPLANT
HOOD PEEL AWAY T7 (MISCELLANEOUS) ×2 IMPLANT
INSERT TRIDENT POLY 36 0DEG (Insert) IMPLANT
IV NS 100ML SINGLE PACK (IV SOLUTION) ×1 IMPLANT
KIT PATIENT CARE HANA TABLE (KITS) ×1 IMPLANT
LIGHT WAVEGUIDE WIDE FLAT (MISCELLANEOUS) ×1 IMPLANT
MANIFOLD NEPTUNE II (INSTRUMENTS) ×1 IMPLANT
MARKER SKIN DUAL TIP RULER LAB (MISCELLANEOUS) ×1 IMPLANT
MAT ABSORB FLUID 56X50 GRAY (MISCELLANEOUS) ×1 IMPLANT
NDL FILTER BLUNT 18X1 1/2 (NEEDLE) ×1 IMPLANT
NDL SAFETY ECLIP 18X1.5 (MISCELLANEOUS) ×1 IMPLANT
NDL SPNL 20GX3.5 QUINCKE YW (NEEDLE) ×1 IMPLANT
NEEDLE FILTER BLUNT 18X1 1/2 (NEEDLE) IMPLANT
NEEDLE SPNL 20GX3.5 QUINCKE YW (NEEDLE) ×1 IMPLANT
NS IRRIG 500ML POUR BTL (IV SOLUTION) ×1 IMPLANT
PACK HIP COMPR (MISCELLANEOUS) ×1 IMPLANT
PAD ARMBOARD 7.5X6 YLW CONV (MISCELLANEOUS) ×1 IMPLANT
SCREW HEX LP 6.5X20 (Screw) IMPLANT
SCREW HEX LP 6.5X30 (Screw) IMPLANT
SHELL ACETABUL CLUSTER SZ 54 (Shell) IMPLANT
SLEEVE SCD COMPRESS KNEE MED (STOCKING) ×1 IMPLANT
SOLUTION IRRIG SURGIPHOR (IV SOLUTION) ×1 IMPLANT
STEM STD OFFSET SZ6 35 (Stem) IMPLANT
SURGIFLO W/THROMBIN 8M KIT (HEMOSTASIS) IMPLANT
SUT BONE WAX W31G (SUTURE) ×1 IMPLANT
SUT DVC 2 QUILL PDO T11 36X36 (SUTURE) ×1 IMPLANT
SUT ETHIBOND 2 V 37 (SUTURE) ×1 IMPLANT
SUT QUILL MONODERM 3-0 PS-2 (SUTURE) ×1 IMPLANT
SUT SILK 0 (SUTURE) ×1
SUT SILK 0 30XBRD TIE 6 (SUTURE) ×1 IMPLANT
SUT VIC AB 0 CT1 36 (SUTURE) ×1 IMPLANT
SUT VIC AB 2-0 CT2 27 (SUTURE) ×2 IMPLANT
SYR 30ML LL (SYRINGE) ×2 IMPLANT
SYR TB 1ML LL NO SAFETY (SYRINGE) ×1 IMPLANT
TAPE MICROFOAM 4IN (TAPE) IMPLANT
TOWEL OR 17X26 4PK STRL BLUE (TOWEL DISPOSABLE) IMPLANT
TRAP FLUID SMOKE EVACUATOR (MISCELLANEOUS) ×1 IMPLANT
WAND WEREWOLF FASTSEAL 6.0 (MISCELLANEOUS) ×1 IMPLANT
WATER STERILE IRR 1000ML POUR (IV SOLUTION) ×1 IMPLANT

## 2022-09-04 NOTE — Anesthesia Preprocedure Evaluation (Signed)
Anesthesia Evaluation  Patient identified by MRN, date of birth, ID band Patient awake    Reviewed: Allergy & Precautions, NPO status , Patient's Chart, lab work & pertinent test results  History of Anesthesia Complications Negative for: history of anesthetic complications  Airway Mallampati: II  TM Distance: >3 FB Neck ROM: full    Dental no notable dental hx.    Pulmonary neg pulmonary ROS   Pulmonary exam normal        Cardiovascular hypertension, On Medications negative cardio ROS Normal cardiovascular exam     Neuro/Psych  Neuromuscular disease  negative psych ROS   GI/Hepatic negative GI ROS, Neg liver ROS,,,  Endo/Other  negative endocrine ROS    Renal/GU      Musculoskeletal  (+) Arthritis , Osteoarthritis,    Abdominal   Peds  Hematology negative hematology ROS (+)   Anesthesia Other Findings Past Medical History: No date: Anemia No date: Arthritis No date: Hypertension No date: Insomnia     Comment:  a.) uses trazodone +/- diphenhydramine PRN sleep No date: Ventral hernia     Comment:  a.) s/p repair 2005  Past Surgical History: 2005: APPENDECTOMY     Comment:  incidental at time of colon surgery. 2000: BACK SURGERY 2005: COLON SURGERY     Comment:  Left colectomy for redundant colon with severe               constipation. No date: COLONOSCOPY 06/09/2020: COLONOSCOPY WITH PROPOFOL; N/A     Comment:  Procedure: COLONOSCOPY WITH PROPOFOL;  Surgeon: Earline Mayotte, MD;  Location: [facility] ENDOSCOPY;  Service:               Endoscopy;  Laterality: N/A; 12/16/2003: HERNIA REPAIR     Comment:  ventral hernia repair with Compsix Kugel mesh  BMI    Body Mass Index: 28.22 kg/m      Reproductive/Obstetrics negative OB ROS                             Anesthesia Physical Anesthesia Plan  ASA: 2  Anesthesia Plan: Spinal   Post-op Pain Management:  Toradol IV (intra-op)* and Ofirmev IV (intra-op)*   Induction: Intravenous  PONV Risk Score and Plan: 2 and Propofol infusion and TIVA  Airway Management Planned: Natural Airway and Nasal Cannula  Additional Equipment:   Intra-op Plan:   Post-operative Plan:   Informed Consent: I have reviewed the patients History and Physical, chart, labs and discussed the procedure including the risks, benefits and alternatives for the proposed anesthesia with the patient or authorized representative who has indicated his/her understanding and acceptance.     Dental Advisory Given  Plan Discussed with: Anesthesiologist, CRNA and Surgeon  Anesthesia Plan Comments: (Patient reports no bleeding problems and no anticoagulant use.  Plan for spinal with backup GA  Patient consented for risks of anesthesia including but not limited to:  - adverse reactions to medications - damage to eyes, teeth, lips or other oral mucosa - nerve damage due to positioning  - risk of bleeding, infection and or nerve damage from spinal that could lead to paralysis - risk of headache or failed spinal - damage to teeth, lips or other oral mucosa - sore throat or hoarseness - damage to heart, brain, nerves, lungs, other parts of body or loss of life  Patient voiced understanding.)  Anesthesia Quick Evaluation

## 2022-09-04 NOTE — Anesthesia Procedure Notes (Signed)
Spinal  Patient location during procedure: OR Start time: 09/04/2022 7:35 AM End time: 09/04/2022 7:41 AM Reason for block: surgical anesthesia Staffing Anesthesiologist: Louie Boston, MD Resident/CRNA: Berniece Pap, CRNA Performed by: Berniece Pap, CRNA Authorized by: Louie Boston, MD   Preanesthetic Checklist Completed: patient identified, IV checked, site marked, risks and benefits discussed, surgical consent, monitors and equipment checked, pre-op evaluation and timeout performed Spinal Block Patient position: sitting Prep: DuraPrep Patient monitoring: heart rate, cardiac monitor, continuous pulse ox and blood pressure Approach: midline Location: L3-4 Injection technique: single-shot Needle Needle type: Sprotte  Needle gauge: 24 G Needle length: 9 cm Assessment Sensory level: T4 Events: CSF return

## 2022-09-04 NOTE — Transfer of Care (Signed)
Immediate Anesthesia Transfer of Care Note  Patient: Cody [name]  Procedure(s) Performed: TOTAL HIP ARTHROPLASTY ANTERIOR APPROACH (Right: Hip)  Patient Location: PACU  Anesthesia Type:Spinal  Level of Consciousness: awake and alert   Airway & Oxygen Therapy: Patient Spontanous Breathing and Patient connected to face mask oxygen  Post-op Assessment: Report given to RN and Post -op Vital signs reviewed and stable  Post vital signs: Reviewed and stable  Last Vitals:  Vitals Value Taken Time  BP 104/71 09/04/22 0935  Temp    Pulse 72 09/04/22 0937  Resp 17 09/04/22 0937  SpO2 94 % 09/04/22 0937  Vitals shown include unvalidated device data.  Last Pain:  Vitals:   09/04/22 0620  TempSrc: Temporal  PainSc: 3          Complications: No notable events documented.

## 2022-09-04 NOTE — H&P (Signed)
History of Present Illness: Cody [name] is an 52 y.o. male who presents for follow-up evaluation of his right hip prior to planned right total hip replacement. The patient has had progressively and worsening right groin and lateral hip pain for the last 3-1/2 years. He reports the pain is constant with walking and intense stabs of sharp pain with certain motions of his leg. He denies any radiation of the pain and reports that it can get up to an 8 out of 10. The patient has been treating the pain with ibuprofen, Goody powder, meloxicam, Flexeril with minimal improvement. He is undergone multiple cortisone injections with the last shot about 7 months ago. He got only about 1 month of relief from his last shot. He underwent a formal course of physical therapy and his hip pain did not improve despite the exercises. He reports that his hip has become a functional limitation for him affecting his ability to walk and perform his activities of daily living. He is not able to put on his socks and shoes without severe pain and limitations. The patient denies fevers, chills, numbness, tingling, shortness of breath, chest pain, recent illness, or any trauma.  Patient is nondiabetic with a recent hemoglobin A1c of 5.9, he has a BMI of 28.4, and he is a non-smoker.  Past Medical History: No past medical history on file.  Past Surgical History: Past Surgical History:  Procedure Laterality Date  BACK SURGERY 2000  colon surgery 2005  part of colon removed because it was twisted  APPENDECTOMY 2005  HERNIA REPAIR 12/16/2003  ventral  COLONOSCOPY 06/09/2020  Normal colon/Repeat 92yrs/JWB   Past Family History: Family History  Problem Relation Age of Onset  Breast cancer Neg Hx  Colon cancer Neg Hx   Medications: Current Outpatient Medications  Medication Sig Dispense Refill  diphenhydrAMINE (BENADRYL) 25 mg tablet Take 25 mg by mouth at bedtime as needed. Takes 4 tablets qhs  lisinopriL (ZESTRIL) 5 MG  tablet Take 5 mg by mouth once daily  sildenafiL (VIAGRA) 100 MG tablet  traZODone (DESYREL) 50 MG tablet Take 25-50 mg by mouth at bedtime as needed  valsartan (DIOVAN) 80 MG tablet   No current facility-administered medications for this visit.   Allergies: No Known Allergies   Visit Vitals: Vitals:  08/30/22 1154  BP: 138/82    Review of Systems:  A comprehensive 14 point ROS was performed, reviewed, and the pertinent orthopaedic findings are documented in the HPI.  Physical Exam: Body mass index is 28.4 kg/m. General/Constitutional: No apparent distress: well-nourished and well developed. Lymphatic: No palpable adenopathy. Pulmonary exam: Lungs clear to auscultation bilaterally no wheezing rales or rhonchi Cardiac exam: Regular rate and rhythm no obvious murmurs rubs or gallops. Vascular: No edema, swelling or tenderness, except as noted in detailed exam. Integumentary: No impressive skin lesions present, except as noted in detailed exam. Neuro/Psych: Normal mood and affect, oriented to person, place and time. Musculoskeletal: Normal, except as noted in detailed exam and in HPI.  Right hip exam  SKIN: intact SWELLING: none WARMTH: no warmth TENDERNESS: none, Stinchfield Positive ROM: 0 degrees internal rotation and 20 degrees external rotation and pain with internal rotation and external rotation localized to the lateral edge of the groin,; Hip Flexion 90 STRENGTH: normal GAIT: stiff-legged STABILITY: stable to testing CREPITUS: yes LEG LENGTH DISCREPANCY: left longer by 6mm NEUROLOGICAL EXAM: normal VASCULAR EXAM: normal LUMBAR SPINE: tenderness: no straight leg raising sign: no motor exam: normal  The contralateral hip was examined  for comparison and it showed: TENDERNESS: none ROM: normal and full STRENGTH: normal STABILITY: stable to testing  Hip Imaging :  I reviewed AP pelvis, right hip AP, right hip lateral, right hip done x-rays performed on  07/10/2022 images reviewed by myself. There is severe right hip degenerative changes with bone-on-bone articulation superiorly with femoral head flattening and deformity, joint space narrowing medially, osteophyte formation, and subchondral cyst formation. The left hip shows mild degenerative changes with a small cam deformity mild medial joint space narrowing and some superior acetabular sclerosis. There is some mild degenerative changes noted to the lumbosacral spine with disc height loss and osteophyte formation. There are altered bowel gas patterns in the left hemipelvis consistent with prior colon surgery as well as some surgical clips noted over the left lower abdomen. No fractures or dislocations noted.  AP pelvis x-ray and lateral sit/stand lumbosacral spine x-rays taken at the last office visit reviewed again today.. There is redemonstration of severe right hip degenerative changes with joint space narrowing, sclerosis, osteophyte formation, and subchondral cyst formation. The sacral inclination motion from sitting to standing is >20 degrees showing preserved spinal pelvic motion.  Assessment:  Right hip osteoarthritis  Plan: Cody [name] is a 52 year old male who presents with right hip bone on bone arthritis. Based upon the patient's continued symptoms and failure to respond to conservative treatment, I have recommended a right total hip replacement for this patient. A long discussion took place with the patient describing what a total joint replacement is and what the procedure would entail. A hip model, similar to the implants that will be used during the operation, was utilized to demonstrate the implants. Choices of implant manufactures were discussed and reviewed. The ability to secure the implant utilizing cement or cementless (press fit) fixation was discussed. Anterior and posterior exposures were discussed. For this patient an appropriate approach will be anterior.   The hospitalization and  post-operative care and rehabilitation were also discussed. The use of perioperative antibiotics and DVT prophylaxis were discussed. The risk, benefits and alternatives to a surgical intervention were discussed at length with the patient. The patient was also advised of risks related to the medical comorbidities and elevated body mass index (BMI). A lengthy discussion took place to review the most common complications including but not limited to: deep vein thrombosis, pulmonary embolus, heart attack, stroke, infection, wound breakdown, heterotopic ossification, dislocation, numbness, leg length in-equality, intraoperative fracture, damage to nerves, tendon,muscles, arteries or other blood vessels, death and other possible complications from anesthesia. The patient was told that we will take steps to minimize these risks by using sterile technique, antibiotics and DVT prophylaxis when appropriate and follow the patient postoperatively in the office setting to monitor progress. The possibility of recurrent pain, no improvement in pain and actual worsening of pain were also discussed with the patient. The risk of dislocation following total hip replacement was discussed and potential precautions to prevent dislocation were reviewed. We also had a specific conversation about the his increased risk of component wear and possible need for revision during his life given his young age.  The discharge plan of care focused on the patient going home following surgery. The patient was encouraged to make the necessary arrangements to have someone stay with them when they are discharged home.   The benefits of surgery were discussed with the patient including the potential for improving the patient's current clinical condition through operative intervention. Alternatives to surgical intervention including continued conservative management were also discussed  in detail. All questions were answered to the satisfaction of the  patient. The patient participated and agreed to the plan of care as well as the use of the recommended implants for their total hip replacement surgery. An information packet was given to the patient to review prior to surgery.   The patient has received medical and cardiac clearance for surgery. All questions answered patient agrees above plan to make preparations for a right anterior total hip replacement.   Portions of this record have been created using Scientist, clinical (histocompatibility and immunogenetics). Dictation errors have been sought, but may not have been identified and corrected.  Reinaldo Berber MD

## 2022-09-04 NOTE — Op Note (Signed)
Patient Name: Cody [name]  ZOX:[id number]  Pre-Operative Diagnosis: Right hip Osteoarthritis  Post-Operative Diagnosis: (same)  Procedure: Right Total Hip Arthroplasty  Components/Implants: Cup: Trident Tritanium Clusterhole 71mm/E w/ x2 screws    Liner: X3 Poly Neutral 36/E  Stem: Insignia #6 Std offset  Head:Biolox Ceramic 36 +13mm  Date of Surgery: 09/04/2022  Surgeon: Reinaldo Berber MD  Assistant: Amador Cunas PA (present and scrubbed throughout the case, critical for assistance with exposure, retraction, instrumentation, and closure)   Anesthesiologist: Joelene Millin  Anesthesia: Spinal   EBL: 150cc  IVF:700cc  Complications: None   Brief history: The patient is a 52 year old male with a history of osteoarthritis of the right hip with pain limiting their range of motion and activities of daily living, which has failed multiple attempts at conservative therapy.  The risks and benefits of total hip arthroplasty as definitive surgical treatment were discussed with the patient, who opted to proceed with the operation.  After outpatient medical clearance and optimization was completed the patient was admitted to [facility] for the procedure.  All preoperative films were reviewed and an appropriate surgical plan was made prior to surgery.   Description of procedure: The patient was brought to the operating room where laterality was confirmed by all those present to be the right side.  The patient was administered spinal anesthesia on a stretcher prior to being moved supine on the operating room table. Patient was given an intravenous dose of antibiotics for surgical prophylaxis and TXA.  All bony prominences and extremities were well padded and the patient was securely attached to the table boots, a perineal post was placed and the patient had a safety strap placed.  Surgical site was prepped with alcohol and chlorhexidine. The surgical site over the hip was and draped in  typical sterile fashion with multiple layers of adhesive and nonadhesive drapes.  The incision site was marked out with a sterile marker and care was taken to assess the position of the ASIS and ensure appropriate position for the incision.    A surgical timeout was then called with participation of all staff in the room the patient was then a confirmed again and laterality confirmed.  Incision was made over the anterior lateral aspect of the proximal thigh in line with the TFL.  Appropriate retractors were placed and all bleeding vessels were coagulated within the subcutaneous and fatty layers.  An incision was made in the TFL fascia in the interval was carefully identified.  The lateral ascending branches of the circumflex vessels were identified, cauterized and carefully dissected. The main vessels were then tied with a 0 silk hand tie.  Retractors were placed around the superior lateral and inferior medial aspects of the femoral neck and a capsulotomy was performed exposing the hip joint.  Retraction stitches were placed and the capsulotomy to assist with visualization.  Femoral neck cut was then made and the femoral head was extracted after placing the leg in traction.  Bone wax was then applied to the proximal cut surface of the femur and aqua mantis was used to address any bleeding around the femoral neck cut.  Retractors were then placed around the acetabulum to fully visualize the joint space, and the remaining labral tissue was removed and pulvinar was removed.   The acetabulum was then sequentially reamed up to the appropriate size in order to get good fit and fill for the acetabular component while under fluoroscopic guidance.  Acetabular component was then placed and malleted into  a secure fit while confirming position and abduction angle and anteversion utilizing fluoroscopy.  2 screws were then placed in the acetabular cup to assist in securing the cup in place. The cup was irrigated and a real  neutral liner was placed, impacted, and checked for stability. The femur traction was dropped and sequentially externally rotated while performing a release of the posterior and superomedial tissues off of the proximal femur to allow for mobility, care was taken to preserve the external rotators and piriformis attachments.  The remaining interval between the abductors and the capsule was dissected out and a retractor was placed over the superolateral aspect of the femur over the greater trochanter.  The leg was carefully brought down into extension and adducted to provide visualization of the proximal femur for broaching.  The femur was then sequentially broached up to an appropriate size which provided for good fill and stability to the femoral broach.  A trial neck and head were placed on the femoral broach and the leg was brought up for reduction.  The hip was reduced and manual check of stability was performed.  The hip was found to be stable in flexion internal rotation and extension external rotation.  Leg lengths were confirmed on fluoroscopy.   The hip was then dislocated the trial neck and head were removed.  The leg was then brought down into extension and adduction in the proximal femur was reexposed.  The broach trial was removed and the femur was irrigated with normal saline prior to the real femoral stem being implanted.  After the femoral stem was seated and shown to have good fit and fill the appropriate head was impacted the leg was brought up and reduced.  There was good range of motion with stability in flexion internal rotation and extension external rotation on testing.  Leg lengths were found to be appropriate on fluoroscopic evaluation at this time.  The hip was then irrigated with betdine based surgiphor solution and then saline solution.  The capsulotomy was repaired with Ethibond sutures.  A pericapsular and peritrochanteric cocktail with Exparel and bupivacaine was then injected as well  as the subcutaneous tissues. The fascia was closed with a #2 barbed running suture.  The deep tissues were closed with Vicryl sutures the subcutaneous tissues were closed with interrupted Vicryl sutures and a running barbed 3-0 suture.  The skin was then reinforced with Dermabond and a sterile dressing was placed.   The patient was awoken from anesthesia transferred off of the operating room table onto a hospital bed where examination of leg lengths found the leg lengths to be equal with a good distal pulse.  The patient was then transferred to the PACU in stable condition.

## 2022-09-04 NOTE — TOC Progression Note (Signed)
Transition of Care [facility]) - Progression Note    Patient Details  Name: Cody [name] MRN: [id number] Date of Birth: [birth year]  [facility] CM/SW Contact  Marlowe Sax, RN Phone Number: 09/04/2022, 3:06 PM  Clinical Narrative:     Patient plans to go to Outpatient PT       Expected Discharge Plan and Services                                               Social Determinants of Health (SDOH) Interventions SDOH Screenings   Food Insecurity: No Food Insecurity (09/04/2022)  Housing: Low Risk  (09/04/2022)  Transportation Needs: No Transportation Needs (09/04/2022)  Utilities: Not At Risk (09/04/2022)  Alcohol Screen: Low Risk  (06/18/2022)  Depression (PHQ2-9): Low Risk  (07/20/2022)  Financial Resource Strain: Low Risk  (06/18/2022)  Physical Activity: Insufficiently Active (06/18/2022)  Social Connections: Socially Isolated (06/18/2022)  Stress: No Stress Concern Present (06/18/2022)  Tobacco Use: Low Risk  (09/04/2022)    Readmission Risk Interventions     No data to display

## 2022-09-04 NOTE — Progress Notes (Signed)
Physical Therapy Treatment Patient Details Name: Cody [name] MRN: [id number] DOB: March 12, 1970 Today's Date: 09/04/2022   History of Present Illness Pt is a 52y/o male s/p R THA with an anterior approach. PMH includes R hip OA and HTN.    PT Comments  Pt presents s/p day 0 for a R THA with anterior approach and denies any pain at rest. He currently lives with his wife who will be assisting him as needed following d/c. He lives in a 1 story home with no steps to enter. PLOF includes independent with all ADLs with no AD and works in Production designer, theatre/television/film for [employer].  PT tested gross LE sensation, noting only slightly diminished light touch on the R back of knee and medial calf. Pt able to perform B/L quad set in supine with HOB elevated with some weakness noted on R leg. Pt performed SAQ and ankle pumps x10 each. He was able to perform bed mobility and sit <>stand transfer with RW and supervision, cueing needed for hand placement. He was also able to ambulate ~17ft with RW and CGA assist due to decreased mobility and balance along with hip discomfort. Pt would benefit from continued therapy due to RLE weakness, balance deficits, and to maximize independence.     Assistance Recommended at Discharge PRN  If plan is discharge home, recommend the following:  Can travel by private vehicle    A little help with walking and/or transfers;Assist for transportation;Assistance with cooking/housework;A little help with bathing/dressing/bathroom      Equipment Recommendations  Rolling walker (2 wheels)    Recommendations for Other Services       Precautions / Restrictions Precautions Precautions: Fall;Anterior Hip Precaution Booklet Issued: Yes (comment) Precaution Comments: No precautions per MD orders, HEP packet given Restrictions Weight Bearing Restrictions: Yes RLE Weight Bearing: Weight bearing as tolerated     Mobility  Bed Mobility Overal bed mobility: Needs Assistance Bed Mobility: Supine  to Sit     Supine to sit: Supervision     General bed mobility comments: Requires increased time and supervision due to decreased sensation    Transfers Overall transfer level: Needs assistance Equipment used: Rolling walker (2 wheels) Transfers: Bed to chair/wheelchair/BSC, Sit to/from Stand Sit to Stand: Supervision           General transfer comment: Requires cueing for hand placement on RW with sit>stand    Ambulation/Gait Ambulation/Gait assistance: Min guard (CGA with ambulation) Gait Distance (Feet): 80 Feet Assistive device: Rolling walker (2 wheels) Gait Pattern/deviations: Decreased stride length       General Gait Details: Decreased gait speed   Stairs             Wheelchair Mobility     Tilt Bed    Modified Rankin (Stroke Patients Only)       Balance Overall balance assessment: Needs assistance Sitting-balance support: Feet supported, No upper extremity supported Sitting balance-Leahy Scale: Normal     Standing balance support: Bilateral upper extremity supported, During functional activity, Reliant on assistive device for balance Standing balance-Leahy Scale: Good                              Cognition Arousal/Alertness: Awake/alert Behavior During Therapy: WFL for tasks assessed/performed Overall Cognitive Status: Within Functional Limits for tasks assessed  Exercises Total Joint Exercises Ankle Circles/Pumps: Strengthening, Both, 10 reps, Supine Short Arc Quad: Strengthening, Right, 10 reps, Supine    General Comments        Pertinent Vitals/Pain Pain Assessment Pain Assessment: 0-10 Pain Score: 2  Pain Location: R Hip while ambulating ~14ft mark, 0/10 at the start of visit Pain Descriptors / Indicators: Discomfort, Aching, Dull Pain Intervention(s): Monitored during session    Home Living Family/patient expects to be discharged to:: Private  residence Living Arrangements: Spouse/significant other Available Help at Discharge: Family Type of Home: House Home Access: Level entry       Home Layout: One level (Planning to sleep on couch, bed is to high to get into at the moment)        Prior Function            PT Goals (current goals can now be found in the care plan section) Acute Rehab PT Goals Patient Stated Goal: Return home PT Goal Formulation: With patient Time For Goal Achievement: 09/06/22 Potential to Achieve Goals: Good    Frequency    BID      PT Plan      Co-evaluation              AM-PAC PT "6 Clicks" Mobility   Outcome Measure  Help needed turning from your back to your side while in a flat bed without using bedrails?: A Little Help needed moving from lying on your back to sitting on the side of a flat bed without using bedrails?: None Help needed moving to and from a bed to a chair (including a wheelchair)?: A Little Help needed standing up from a chair using your arms (e.g., wheelchair or bedside chair)?: None Help needed to walk in hospital room?: A Little Help needed climbing 3-5 steps with a railing? : A Little 6 Click Score: 20    End of Session Equipment Utilized During Treatment: Gait belt Activity Tolerance: Patient tolerated treatment well Patient left: in chair;with family/visitor present;with call bell/phone within reach;with SCD's reapplied Nurse Communication: Mobility status (Urination staus, did not use the bathroom during PT treatment) PT Visit Diagnosis: Muscle weakness (generalized) (M62.81);Other abnormalities of gait and mobility (R26.89);Pain Pain - Right/Left: Right Pain - part of body: Hip     Time: 1308-6578 PT Time Calculation (min) (ACUTE ONLY): 24 min  Charges:    $Therapeutic Activity: 8-22 mins PT General Charges $$ ACUTE PT VISIT: 1 Visit                     Skylen Danielsen, PT, SPT 4:02 PM,09/04/22

## 2022-09-04 NOTE — Interval H&P Note (Signed)
Patient history and physical updated. Consent reviewed including risks, benefits, and alternatives to surgery. Patient agrees with above plan to proceed with right anterior total hip arthroplasty.   

## 2022-09-04 NOTE — Plan of Care (Signed)
  Problem: Pain Management: Goal: Pain level will decrease with appropriate interventions Outcome: Progressing   Problem: Skin Integrity: Goal: Will show signs of wound healing Outcome: Progressing   

## 2022-09-05 ENCOUNTER — Encounter: Payer: Self-pay | Admitting: Orthopedic Surgery

## 2022-09-05 ENCOUNTER — Other Ambulatory Visit: Payer: Self-pay

## 2022-09-05 DIAGNOSIS — M1611 Unilateral primary osteoarthritis, right hip: Secondary | ICD-10-CM | POA: Diagnosis not present

## 2022-09-05 LAB — BASIC METABOLIC PANEL
Anion gap: 9 (ref 5–15)
BUN: 15 mg/dL (ref 6–20)
CO2: 21 mmol/L — ABNORMAL LOW (ref 22–32)
Calcium: 8.4 mg/dL — ABNORMAL LOW (ref 8.9–10.3)
Chloride: 106 mmol/L (ref 98–111)
Creatinine, Ser: 1.06 mg/dL (ref 0.61–1.24)
GFR, Estimated: >60 mL/min (ref >60)
Glucose, Bld: 121 mg/dL — ABNORMAL HIGH (ref 70–99)
Potassium: 4 mmol/L (ref 3.5–5.1)
Sodium: 136 mmol/L (ref 135–145)

## 2022-09-05 LAB — CBC
HCT: 37.1 % — ABNORMAL LOW (ref 39.0–52.0)
Hemoglobin: 12.7 g/dL — ABNORMAL LOW (ref 13.0–17.0)
MCH: 30.7 pg (ref 26.0–34.0)
MCHC: 34.2 g/dL (ref 30.0–36.0)
MCV: 89.6 fL (ref 80.0–100.0)
Platelets: 265 10*3/uL (ref 150–400)
RBC: 4.14 MIL/uL — ABNORMAL LOW (ref 4.22–5.81)
RDW: 13.4 % (ref 11.5–15.5)
WBC: 14.4 10*3/uL — ABNORMAL HIGH (ref 4.0–10.5)
nRBC: 0 % (ref 0.0–0.2)

## 2022-09-05 MED ORDER — CELECOXIB 200 MG PO CAPS
200.0000 mg | ORAL_CAPSULE | Freq: Two times a day (BID) | ORAL | 0 refills | Status: AC
  Filled 2022-09-05: qty 28, 14d supply, fill #0

## 2022-09-05 MED ORDER — ONDANSETRON HCL 4 MG PO TABS
4.0000 mg | ORAL_TABLET | Freq: Four times a day (QID) | ORAL | 0 refills | Status: DC | PRN
  Filled 2022-09-05: qty 20, 5d supply, fill #0

## 2022-09-05 MED ORDER — DOCUSATE SODIUM 100 MG PO CAPS
ORAL_CAPSULE | ORAL | Status: AC
  Filled 2022-09-05: qty 1

## 2022-09-05 MED ORDER — ENOXAPARIN SODIUM 40 MG/0.4ML IJ SOSY
PREFILLED_SYRINGE | INTRAMUSCULAR | Status: AC
  Filled 2022-09-05: qty 0.4

## 2022-09-05 MED ORDER — TRAMADOL HCL 50 MG PO TABS
50.0000 mg | ORAL_TABLET | Freq: Four times a day (QID) | ORAL | 0 refills | Status: DC | PRN
  Filled 2022-09-05: qty 30, 8d supply, fill #0

## 2022-09-05 MED ORDER — ACETAMINOPHEN 500 MG PO TABS
ORAL_TABLET | ORAL | Status: AC
  Filled 2022-09-05: qty 2

## 2022-09-05 MED ORDER — PANTOPRAZOLE SODIUM 40 MG PO TBEC
DELAYED_RELEASE_TABLET | ORAL | Status: AC
  Filled 2022-09-05: qty 1

## 2022-09-05 MED ORDER — DOCUSATE SODIUM 100 MG PO CAPS
100.0000 mg | ORAL_CAPSULE | Freq: Two times a day (BID) | ORAL | 0 refills | Status: DC
  Filled 2022-09-05: qty 10, 5d supply, fill #0

## 2022-09-05 MED ORDER — LISINOPRIL 5 MG PO TABS
ORAL_TABLET | ORAL | Status: AC
  Filled 2022-09-05: qty 1

## 2022-09-05 MED ORDER — ACETAMINOPHEN 500 MG PO TABS
1000.0000 mg | ORAL_TABLET | Freq: Three times a day (TID) | ORAL | 0 refills | Status: DC
  Filled 2022-09-05: qty 30, 5d supply, fill #0

## 2022-09-05 MED ORDER — ENOXAPARIN SODIUM 40 MG/0.4ML IJ SOSY
40.0000 mg | PREFILLED_SYRINGE | INTRAMUSCULAR | 0 refills | Status: DC
  Filled 2022-09-05: qty 5.6, 14d supply, fill #0

## 2022-09-05 MED ORDER — KETOROLAC TROMETHAMINE 15 MG/ML IJ SOLN
INTRAMUSCULAR | Status: AC
  Filled 2022-09-05: qty 1

## 2022-09-05 MED ORDER — OXYCODONE HCL 5 MG PO TABS
2.5000 mg | ORAL_TABLET | Freq: Three times a day (TID) | ORAL | 0 refills | Status: DC | PRN
  Filled 2022-09-05: qty 10, 7d supply, fill #0

## 2022-09-05 NOTE — Discharge Summary (Signed)
Physician Discharge Summary  Patient ID: Cody [name] MRN: [id number] DOB/AGE: [birth year] 52 y.o.  Admit date: 09/04/2022 Discharge date: 09/05/2022  Admission Diagnoses:  Osteoarthritis of right hip [M16.11]   Discharge Diagnoses: Patient Active Problem List   Diagnosis Date Noted   Osteoarthritis of right hip 09/04/2022   Chronic right SI joint pain 05/09/2021   History of lumbar laminectomy 10/25/2020   Chronic radicular lumbar pain 10/25/2020   Neuropathic pain 10/25/2020   Lumbar facet arthropathy 10/25/2020   Chronic pain syndrome 10/25/2020   Piriformis syndrome, left 10/05/2020   Sacroiliac joint dysfunction of left side 10/04/2020   Back pain of lumbosacral region with sciatica 10/04/2020   Greater trochanteric bursitis, right 10/04/2020   Primary osteoarthritis of right hip 07/06/2020   Gluteal tendinitis, right hip 06/22/2020   Onychomycosis 09/12/2015   CN (constipation) 01/02/2014    Past Medical History:  Diagnosis Date   Anemia    Arthritis    Hypertension    Insomnia    a.) uses trazodone +/- diphenhydramine PRN sleep   Ventral hernia    a.) s/p repair 2005     Transfusion: none   Consultants (if any):   Discharged Condition: Improved  Hospital Course: Cody [name] is an 52 y.o. male who was admitted 09/04/2022 with a diagnosis of Osteoarthritis of right hip and went to the operating room on 09/04/2022 and underwent the above named procedures.    Surgeries: Procedure(s): TOTAL HIP ARTHROPLASTY ANTERIOR APPROACH on 09/04/2022 Patient tolerated the surgery well. Taken to PACU where she was stabilized and then transferred to the orthopedic floor.  Started on Lovenox 40 mg q 24 hrs. TEDs and SCDs applied bilaterally. Heels elevated on bed. No evidence of DVT. Negative Homan. Physical therapy started on day #1 for gait training and transfer. OT started day #1 for ADL and assisted devices.  Patient's IV was d/c on day #1. Patient was able to safely and  independently complete all PT goals. PT recommending discharge to home.    On post op day #1 patient was stable and ready for discharge to home with HHPT.  Implants:  Cup: Trident Tritanium Clusterhole 26mm/E w/ x2 screws    Liner: X3 Poly Neutral 36/E  Stem: Insignia #6 Std offset  Head:Biolox Ceramic 36 +25mm    He was given perioperative antibiotics:  Anti-infectives (From admission, onward)    Start     Dose/Rate Route Frequency Ordered Stop   09/04/22 1400  ceFAZolin (ANCEF) IVPB 2g/100 mL premix        2 g 200 mL/hr over 30 Minutes Intravenous Every 6 hours 09/04/22 1032 09/04/22 2034   09/04/22 0600  ceFAZolin (ANCEF) IVPB 2g/100 mL premix        2 g 200 mL/hr over 30 Minutes Intravenous On call to O.R. 09/03/22 2211 09/04/22 0750     .  He was given sequential compression devices, early ambulation, and Lovenox, teds for DVT prophylaxis.  He benefited maximally from the hospital stay and there were no complications.    Recent vital signs:  Vitals:   09/05/22 0426 09/05/22 0737  BP: 126/82 126/87  Pulse: 74 72  Resp: 16 16  Temp: 97.9 F (36.6 C) 98.9 F (37.2 C)  SpO2: 96% 96%    Recent laboratory studies:  Lab Results  Component Value Date   HGB 12.7 (L) 09/05/2022   HGB 15.1 08/23/2022   HGB 15.7 10/31/2017   Lab Results  Component Value Date   WBC 14.4 (  H) 09/05/2022   PLT 265 09/05/2022   No results found for: "INR" Lab Results  Component Value Date   NA 136 09/05/2022   K 4.0 09/05/2022   CL 106 09/05/2022   CO2 21 (L) 09/05/2022   BUN 15 09/05/2022   CREATININE 1.06 09/05/2022   GLUCOSE 121 (H) 09/05/2022    Discharge Medications:   Allergies as of 09/05/2022   No Known Allergies      Medication List     TAKE these medications    acetaminophen 500 MG tablet Commonly known as: TYLENOL Take 2 tablets (1,000 mg total) by mouth every 8 (eight) hours.   celecoxib 200 MG capsule Commonly known as: CeleBREX Take 1 capsule (200 mg total)  by mouth 2 (two) times daily for 14 days.   diphenhydrAMINE 25 MG tablet Commonly known as: BENADRYL Take 25 mg by mouth at bedtime as needed. Takes 4 tablets qhs   docusate sodium 100 MG capsule Commonly known as: COLACE Take 1 capsule (100 mg total) by mouth 2 (two) times daily.   enoxaparin 40 MG/0.4ML injection Commonly known as: LOVENOX Inject 0.4 mLs (40 mg total) into the skin daily for 14 days. Start taking on: September 06, 2022   GOODYS BACK & BODY PAIN PO Take 1 Dose by mouth daily. qhs   lisinopril 5 MG tablet Commonly known as: ZESTRIL Take 1 tablet (5 mg total) by mouth daily.   multivitamin capsule Take 1 capsule by mouth daily.   ondansetron 4 MG tablet Commonly known as: ZOFRAN Take 1 tablet (4 mg total) by mouth every 6 (six) hours as needed for nausea.   oxyCODONE 5 MG immediate release tablet Commonly known as: Roxicodone Take 0.5 tablets (2.5 mg total) by mouth every 8 (eight) hours as needed for breakthrough pain.   traMADol 50 MG tablet Commonly known as: ULTRAM Take 1 tablet (50 mg total) by mouth every 6 (six) hours as needed for moderate pain.   traZODone 50 MG tablet Commonly known as: DESYREL Take 0.5-1 tablets (25-50 mg total) by mouth at bedtime as needed for sleep.        Diagnostic Studies: DG HIP UNILAT WITH PELVIS 1V RIGHT  Result Date: 09/04/2022 CLINICAL DATA:  Elective surgery. EXAM: DG HIP (WITH OR WITHOUT PELVIS) 1V RIGHT COMPARISON:  None Available. FINDINGS: Three fluoroscopic spot views of the pelvis and right hip obtained in the operating room. Images provided after hip arthroplasty. Fluoroscopy time 17 seconds. Dose 2.3483 mGy. IMPRESSION: Intraoperative fluoroscopy during right hip arthroplasty. Electronically Signed   By: Narda Rutherford M.D.   On: 09/04/2022 10:19   DG C-Arm 1-60 Min-No Report  Result Date: 09/04/2022 Fluoroscopy was utilized by the requesting physician.  No radiographic interpretation.    Disposition:       Follow-up Information     Evon Slack, PA-C Follow up in 2 week(s).   Specialties: Orthopedic Surgery, Emergency Medicine Contact information: [address] [place] [address] [phone]                  Signed: Patience Musca 09/05/2022, 8:03 AM

## 2022-09-05 NOTE — Progress Notes (Signed)
DISCHARGE NOTE:  Pt given discharge instructions and verbalized understanding. TED hose on both legs. BSC and walker delivered to room and sent with pt. Pt wheeled to car by staff, family providing transportation.

## 2022-09-05 NOTE — Progress Notes (Addendum)
Physical Therapy Treatment Patient Details Name: Cody [name] MRN: [id number] DOB: 01-Jan-1971 Today's Date: 09/05/2022   History of Present Illness Pt is a 52y/o male s/p R THA with an anterior approach. PMH includes R hip OA and HTN.    PT Comments  Pt is in bed, A&Ox4, reports 2/10 pain in R hip at rest up to 3/10 with ambulation. Pt performed exercises prior to transfers/ambulation, tolerated well c/ minimal cueing needed. Pt performed bed mobility and transfers supervision c/ RW, ambulated ~223ft c/ RW and CGA progressing to supervision. Pt performed 4 steps up/down c/ CGA and cueing for sequencing. Pt left in bed, breakfast tray, needs within reach. Nursing informed about DME needs. Pt would benefit from acute therapy services to improve mobility, functional activity tolerance, and maximize independence.     Assistance Recommended at Discharge PRN  If plan is discharge home, recommend the following:  Can travel by private vehicle    A little help with walking and/or transfers;Assist for transportation;Assistance with cooking/housework;A little help with bathing/dressing/bathroom      Equipment Recommendations  Rolling walker (2 wheels)    Recommendations for Other Services       Precautions / Restrictions Precautions Precautions: Fall;Anterior Hip Restrictions Weight Bearing Restrictions: Yes RLE Weight Bearing: Weight bearing as tolerated     Mobility  Bed Mobility Overal bed mobility: Needs Assistance Bed Mobility: Supine to Sit     Supine to sit: Supervision          Transfers Overall transfer level: Needs assistance Equipment used: Rolling walker (2 wheels) Transfers: Sit to/from Stand Sit to Stand: Supervision           General transfer comment: cueing for hand placement prior to transfer    Ambulation/Gait Ambulation/Gait assistance: Supervision, Min guard Gait Distance (Feet): 220 Feet Assistive device: Rolling walker (2 wheels) Gait  Pattern/deviations: Step-through pattern, Decreased stride length       General Gait Details: slightly decreased, better DME use today   Stairs Stairs: Yes Stairs assistance: Min guard Stair Management: Two rails Number of Stairs: 4 General stair comments: 4 steps up/down CGA   Wheelchair Mobility     Tilt Bed    Modified Rankin (Stroke Patients Only)       Balance Overall balance assessment: Needs assistance Sitting-balance support: Feet supported, No upper extremity supported Sitting balance-Leahy Scale: Normal     Standing balance support: Bilateral upper extremity supported Standing balance-Leahy Scale: Good                              Cognition Arousal/Alertness: Awake/alert Behavior During Therapy: WFL for tasks assessed/performed Overall Cognitive Status: Within Functional Limits for tasks assessed                                          Exercises Total Joint Exercises Quad Sets: AROM, 10 reps, Supine, Both Heel Slides: AROM, Both, 10 reps, Supine Long Arc Quad: AROM, Both, 10 reps, Seated Marching in Standing: AROM, Both, 10 reps, Seated    General Comments        Pertinent Vitals/Pain Pain Assessment Pain Score: 3  Pain Intervention(s): Limited activity within patient's tolerance, Monitored during session, Repositioned    Home Living  Prior Function            PT Goals (current goals can now be found in the care plan section) Progress towards PT goals: Progressing toward goals    Frequency    BID      PT Plan Current plan remains appropriate    Co-evaluation              AM-PAC PT "6 Clicks" Mobility   Outcome Measure  Help needed turning from your back to your side while in a flat bed without using bedrails?: None Help needed moving from lying on your back to sitting on the side of a flat bed without using bedrails?: None Help needed moving to and from  a bed to a chair (including a wheelchair)?: None Help needed standing up from a chair using your arms (e.g., wheelchair or bedside chair)?: None Help needed to walk in hospital room?: None Help needed climbing 3-5 steps with a railing? : A Little 6 Click Score: 23    End of Session Equipment Utilized During Treatment: Gait belt Activity Tolerance: Patient tolerated treatment well Patient left: in chair;with call bell/phone within reach;with SCD's reapplied Nurse Communication: Mobility status PT Visit Diagnosis: Muscle weakness (generalized) (M62.81);Other abnormalities of gait and mobility (R26.89);Pain Pain - Right/Left: Right Pain - part of body: Hip     Time: 1610-9604 PT Time Calculation (min) (ACUTE ONLY): 14 min  Charges:    $Therapeutic Activity: 8-22 mins PT General Charges $$ ACUTE PT VISIT: 1 Visit                    Lala Lund, PT, SPT  11:11 AM,09/05/22

## 2022-09-05 NOTE — Progress Notes (Signed)
Patient is not able to walk the distance required to go the bathroom, or he is unable to safely negotiate stairs required to access the bathroom.  A 3in1 BSC will alleviate this problem.        T. Chris Jeronimo Hellberg, PA-C [facility] 

## 2022-09-05 NOTE — Discharge Instructions (Signed)
Instructions after Anterior Total Hip Replacement        Dr. Zachary Aberman, Jr., M.D.      Dept. of Orthopaedics & Sports Medicine  [facility]  [address]  [place], [place]  [address]  Phone: [phone]   Fax: [phone]    DIET: Drink plenty of non-alcoholic fluids. Resume your normal diet. Include foods high in fiber.  ACTIVITY:  You may use crutches or a walker with weight-bearing as tolerated, unless instructed otherwise. You may be weaned off of the walker or crutches by your Physical Therapist.  Continue doing gentle exercises. Exercising will reduce the pain and swelling, increase motion, and prevent muscle weakness.   Please continue to use the TED compression stockings for 2 weeks. You may remove the stockings at night, but should reapply them in the morning. Do not drive or operate any equipment until instructed.  WOUND CARE:  Continue to use ice packs periodically to reduce pain and swelling. You may shower with honeycomb dressing 3 days after your surgery. Do not submerge incision site under water. Remove honeycomb dressing 7 days after surgery and allow dermabond to fall off on its own.   MEDICATIONS: You may resume your regular medications. Please take the pain medication as prescribed on the medication list. Do not take pain medication on an empty stomach. You have been given a prescription for a blood thinner to prevent blood clots. Please take the medication as instructed. (NOTE: After completing a 2 week course of Lovenox, take one Enteric-coated 81 mg aspirin twice a day for 3 additional weeks.) Pain medications and iron supplements can cause constipation. Use a stool softener (Senokot or Colace) on a daily basis and a laxative (dulcolax or miralax) as needed. Do not drive or drink alcoholic beverages when taking pain medications.  POSTOPERATIVE CONSTIPATION PROTOCOL Constipation - defined medically as fewer than three stools per week and  severe constipation as less than one stool per week.  One of the most common issues patients have following surgery is constipation.  Even if you have a regular bowel pattern at home, your normal regimen is likely to be disrupted due to multiple reasons following surgery.  Combination of anesthesia, postoperative narcotics, change in appetite and fluid intake all can affect your bowels.  In order to avoid complications following surgery, here are some recommendations in order to help you during your recovery period.  Colace (docusate) - Pick up an over-the-counter form of Colace or another stool softener and take twice a day as long as you are requiring postoperative pain medications.  Take with a full glass of water daily.  If you experience loose stools or diarrhea, hold the colace until you stool forms back up.  If your symptoms do not get better within 1 week or if they get worse, check with your doctor.  Dulcolax (bisacodyl) - Pick up over-the-counter and take as directed by the product packaging as needed to assist with the movement of your bowels.  Take with a full glass of water.  Use this product as needed if not relieved by Colace only.   MiraLax (polyethylene glycol) - Pick up over-the-counter to have on hand.  MiraLax is a solution that will increase the amount of water in your bowels to assist with bowel movements.  Take as directed and can mix with a glass of water, juice, soda, coffee, or tea.  Take if you go more than two days without a movement. Do not use MiraLax more than   once per day. Call your doctor if you are still constipated or irregular after using this medication for 7 days in a row.  If you continue to have problems with postoperative constipation, please contact the office for further assistance and recommendations.  If you experience "the worst abdominal pain ever" or develop nausea or vomiting, please contact the office immediatly for further recommendations for  treatment.   CALL THE OFFICE FOR: Temperature above 101 degrees Excessive bleeding or drainage on the dressing. Excessive swelling, coldness, or paleness of the toes. Persistent nausea and vomiting.  FOLLOW-UP:  You should have an appointment to return to the office in 2 weeks after surgery. Arrangements have been made for continuation of Physical Therapy (either home therapy or outpatient therapy).  

## 2022-09-05 NOTE — Progress Notes (Addendum)
   Subjective: 1 Day Post-Op Procedure(s) (LRB): TOTAL HIP ARTHROPLASTY ANTERIOR APPROACH (Right) Patient reports pain as mild.   Patient is well, and has had no acute complaints or problems Denies any CP, SOB, ABD pain. We will continue therapy today.  Plan is to go Home after hospital stay.  Objective: Vital signs in last 24 hours: Temp:  [97.4 F (36.3 C)-98.9 F (37.2 C)] 98.9 F (37.2 C) (07/09 0737) Pulse Rate:  [55-81] 72 (07/09 0737) Resp:  [10-18] 16 (07/09 0737) BP: (94-126)/(71-90) 126/87 (07/09 0737) SpO2:  [95 %-99 %] 96 % (07/09 0737)  Intake/Output from previous day: 07/08 0701 - 07/09 0700 In: 2718 [P.O.:240; I.V.:2378; IV Piggyback:100] Out: 1000 [Urine:850; Blood:150] Intake/Output this shift: No intake/output data recorded.  Recent Labs    09/05/22 0612  HGB 12.7*   Recent Labs    09/05/22 0612  WBC 14.4*  RBC 4.14*  HCT 37.1*  PLT 265   Recent Labs    09/05/22 0612  NA 136  K 4.0  CL 106  CO2 21*  BUN 15  CREATININE 1.06  GLUCOSE 121*  CALCIUM 8.4*   No results for input(s): "LABPT", "INR" in the last 72 hours.  EXAM General - Patient is Alert, Appropriate, and Oriented Extremity - Neurovascular intact Sensation intact distally Intact pulses distally Dorsiflexion/Plantar flexion intact Dressing - dressing C/D/I and no drainage Motor Function - intact, moving foot and toes well on exam.   Past Medical History:  Diagnosis Date   Anemia    Arthritis    Hypertension    Insomnia    a.) uses trazodone +/- diphenhydramine PRN sleep   Ventral hernia    a.) s/p repair 2005    Assessment/Plan:   1 Day Post-Op Procedure(s) (LRB): TOTAL HIP ARTHROPLASTY ANTERIOR APPROACH (Right) Principal Problem:   Osteoarthritis of right hip  Estimated body mass index is 28.22 kg/m as calculated from the following:   Height as of this encounter: 6\' 2"  (1.88 m).   Weight as of this encounter: 99.7 kg. Advance diet Up with therapy Patient  doing well this morning.  Pain well-controlled.  Ambulatory with no complications. Labs and vital signs are stable Care management to assist with discharge to home with home health PT  DVT Prophylaxis - Lovenox, TED hose, and SCDs Weight-Bearing as tolerated to right leg   T. Cranston Neighbor, PA-C [facility] 09/05/2022, 8:00 AM  Patient seen and examined, agree with above plan.  The patient is doing well status post right anterior total hip arthroplasty, no concerns at this time.  Pain is controlled.  Discussed DVT prophylaxis, pain medication use, and safe transition to home.  All questions answered the patient agrees with above plan will go home after clears PT.   Reinaldo Berber MD

## 2022-09-05 NOTE — TOC Progression Note (Signed)
Transition of Care [facility]) - Progression Note    Patient Details  Name: Cody [name] MRN: [id number] Date of Birth: 1970/11/29  Transition of [facility]) CM/SW Contact  Marlowe Sax, RN Phone Number: 09/05/2022, 9:05 AM  Clinical Narrative:     [employer] to deliver the RW And 3 in 1 to the patient at the bedside  Expected Discharge Plan: OP Rehab Barriers to Discharge: No Barriers Identified  Expected Discharge Plan and Services   Discharge Planning Services: CM Consult   Living arrangements for the past 2 months: Single Family Home Expected Discharge Date: 09/05/22               DME Arranged: Dan Humphreys rolling, 3-N-1 DME Agency: [employer] Date DME Agency Contacted: 09/05/22 Time DME Agency Contacted: [phone] Representative spoke with at DME Agency: Osvaldo Angst Arranged: NA HH Agency: NA         Social Determinants of Health (SDOH) Interventions SDOH Screenings   Food Insecurity: No Food Insecurity (09/04/2022)  Housing: Low Risk  (09/04/2022)  Transportation Needs: No Transportation Needs (09/04/2022)  Utilities: Not At Risk (09/04/2022)  Alcohol Screen: Low Risk  (06/18/2022)  Depression (PHQ2-9): Low Risk  (07/20/2022)  Financial Resource Strain: Low Risk  (06/18/2022)  Physical Activity: Insufficiently Active (06/18/2022)  Social Connections: Socially Isolated (06/18/2022)  Stress: No Stress Concern Present (06/18/2022)  Tobacco Use: Low Risk  (09/04/2022)    Readmission Risk Interventions     No data to display

## 2022-09-05 NOTE — Anesthesia Postprocedure Evaluation (Signed)
Anesthesia Post Note  Patient: Cody [name]  Procedure(s) Performed: TOTAL HIP ARTHROPLASTY ANTERIOR APPROACH (Right: Hip)  Patient location during evaluation: Nursing Unit Anesthesia Type: Spinal Level of consciousness: awake, awake and alert and oriented Pain management: pain level controlled Vital Signs Assessment: post-procedure vital signs reviewed and stable Respiratory status: spontaneous breathing, nonlabored ventilation and respiratory function stable Cardiovascular status: blood pressure returned to baseline and stable Postop Assessment: no headache and no backache Anesthetic complications: no  No notable events documented.   Last Vitals:  Vitals:   09/05/22 0426 09/05/22 0737  BP: 126/82 126/87  Pulse: 74 72  Resp: 16 16  Temp: 36.6 C 37.2 C  SpO2: 96% 96%    Last Pain:  Vitals:   09/05/22 0737  TempSrc: Temporal  PainSc:                  [name]

## 2022-09-05 NOTE — Plan of Care (Signed)
  Problem: Activity: Goal: Ability to avoid complications of mobility impairment will improve Outcome: Progressing   Problem: Pain Management: Goal: Pain level will decrease with appropriate interventions Outcome: Progressing   Problem: Skin Integrity: Goal: Will show signs of wound healing Outcome: Progressing   Problem: Activity: Goal: Risk for activity intolerance will decrease Outcome: Progressing   

## 2022-09-06 ENCOUNTER — Encounter: Payer: Self-pay | Admitting: Family Medicine

## 2022-09-06 ENCOUNTER — Other Ambulatory Visit: Payer: Self-pay | Admitting: Family Medicine

## 2022-09-06 ENCOUNTER — Telehealth: Payer: Self-pay

## 2022-09-06 DIAGNOSIS — F5101 Primary insomnia: Secondary | ICD-10-CM

## 2022-09-06 NOTE — Transitions of Care (Post Inpatient/ED Visit) (Signed)
09/06/2022  Name: Cody [name] MRN: [id number] DOB: [birth year]  Today's TOC FU Call Status: Today's TOC FU Call Status:: Successful TOC FU Call Competed TOC FU Call Complete Date: 09/06/22  Transition Care Management Follow-up Telephone Call Date of Discharge: 09/05/22 Discharge Facility: [facility]) Type of Discharge: Inpatient Admission Primary Inpatient Discharge Diagnosis:: right hip replacement How have you been since you were released from the hospital?: Better Any questions or concerns?: No  Items Reviewed: Did you receive and understand the discharge instructions provided?: Yes Medications obtained,verified, and reconciled?: Yes (Medications Reviewed) Any new allergies since your discharge?: No Dietary orders reviewed?: Yes Do you have support at home?: Yes People in Home: spouse  Medications Reviewed Today: Medications Reviewed Today     Reviewed by Karena Addison, LPN (Licensed Practical Nurse) on 09/06/22 at [phone]  Med List Status: <None>   Medication Order Taking? Sig Documenting Provider Last Dose Status Informant  acetaminophen (TYLENOL) 500 MG tablet [id number]  Take 2 tablets (1,000 mg total) by mouth every 8 (eight) hours. Evon Slack, PA-C  Active   Aspirin-Acetaminophen (GOODYS BACK & BODY PAIN PO) [id number] No Take 1 Dose by mouth daily. qhs [provider] 09/01/2022 Active Self  celecoxib (CELEBREX) 200 MG capsule [id number]  Take 1 capsule (200 mg total) by mouth 2 (two) times daily for 14 days. Evon Slack, PA-C  Active   diphenhydrAMINE (BENADRYL) 25 MG tablet [id number] No Take 25 mg by mouth at bedtime as needed. Takes 4 tablets qhs [provider] 09/03/2022 Active Self  docusate sodium (COLACE) 100 MG capsule [id number]  Take 1 capsule (100 mg total) by mouth 2 (two) times daily. Evon Slack, PA-C  Active   enoxaparin (LOVENOX) 40 MG/0.4ML injection [id number]  Inject 0.4 mLs (40 mg total) into the  skin daily for 14 days. Evon Slack, PA-C  Active   lisinopril (ZESTRIL) 5 MG tablet [id number] No Take 1 tablet (5 mg total) by mouth daily. Duanne Limerick, MD 09/03/2022 Active   Multiple Vitamin (MULTIVITAMIN) capsule [id number] No Take 1 capsule by mouth daily. [provider] 09/01/2022 Active Self  ondansetron (ZOFRAN) 4 MG tablet [id number]  Take 1 tablet (4 mg total) by mouth every 6 (six) hours as needed for nausea. Evon Slack, PA-C  Active   oxyCODONE (ROXICODONE) 5 MG immediate release tablet [id number]  Take 0.5 tablets (2.5 mg total) by mouth every 8 (eight) hours as needed for breakthrough pain. Evon Slack, PA-C  Active   traMADol (ULTRAM) 50 MG tablet [id number]  Take 1 tablet (50 mg total) by mouth every 6 (six) hours as needed for moderate pain. Evon Slack, PA-C  Active   traZODone (DESYREL) 50 MG tablet [id number] No Take 0.5-1 tablets (25-50 mg total) by mouth at bedtime as needed for sleep. Duanne Limerick, MD 09/03/2022 Active             Home Care and Equipment/Supplies: Were Home Health Services Ordered?: NA Any new equipment or medical supplies ordered?: Yes Name of Medical supply agency?: [employer] Were you able to get the equipment/medical supplies?: Yes Do you have any questions related to the use of the equipment/supplies?: No  Functional Questionnaire: Do you need assistance with bathing/showering or dressing?: Yes Do you need assistance with meal preparation?: No Do you need assistance with eating?: No Do you have difficulty maintaining continence: No Do you need assistance with getting out of bed/getting out of a chair/moving?: No  Do you have difficulty managing or taking your medications?: No  Follow up appointments reviewed: PCP Follow-up appointment confirmed?: NA Specialist Hospital Follow-up appointment confirmed?: Yes Date of Specialist follow-up appointment?: 09/19/22 Follow-Up Specialty Provider:: ortho Do you need  transportation to your follow-up appointment?: No Do you understand care options if your condition(s) worsen?: Yes-patient verbalized understanding    SIGNATURE Karena Addison, LPN [facility] Nurse Health Advisor Direct Dial [phone]

## 2022-09-07 ENCOUNTER — Other Ambulatory Visit: Payer: Self-pay

## 2022-09-07 DIAGNOSIS — F5101 Primary insomnia: Secondary | ICD-10-CM

## 2022-09-07 DIAGNOSIS — Z96641 Presence of right artificial hip joint: Secondary | ICD-10-CM | POA: Diagnosis not present

## 2022-09-07 MED ORDER — TRAZODONE HCL 50 MG PO TABS
25.0000 mg | ORAL_TABLET | Freq: Every evening | ORAL | 3 refills | Status: DC | PRN
Start: 2022-09-07 — End: 2022-12-22
  Filled 2022-09-07 (×2): qty 30, 30d supply, fill #0
  Filled 2022-10-09: qty 30, 30d supply, fill #1

## 2022-09-11 IMAGING — CR DG LUMBAR SPINE COMPLETE 4+V
5 series · 5 of 5 positions shown · non-contrast
Comparison: None.

CLINICAL DATA: Lumbar degenerative disc disease. Severe left-sided
lumbar pain. History of surgery.

EXAM:
LUMBAR SPINE - COMPLETE 4+ VIEW

[l-spine ap]
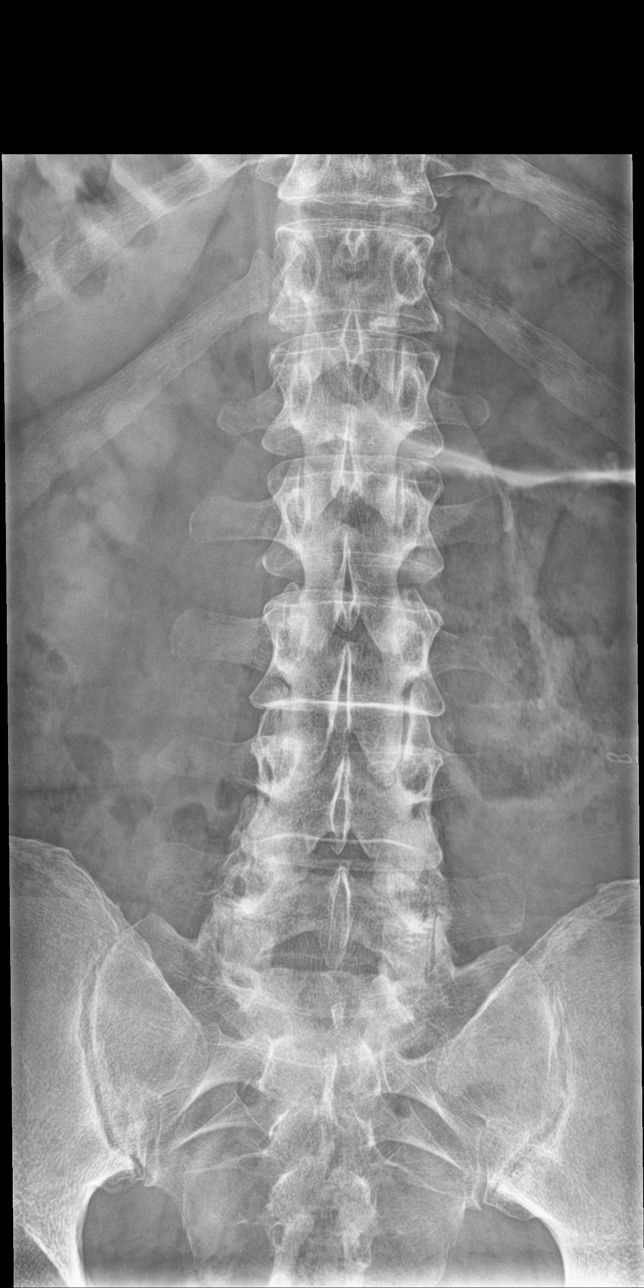

[l-spine obl (1 of 2)]
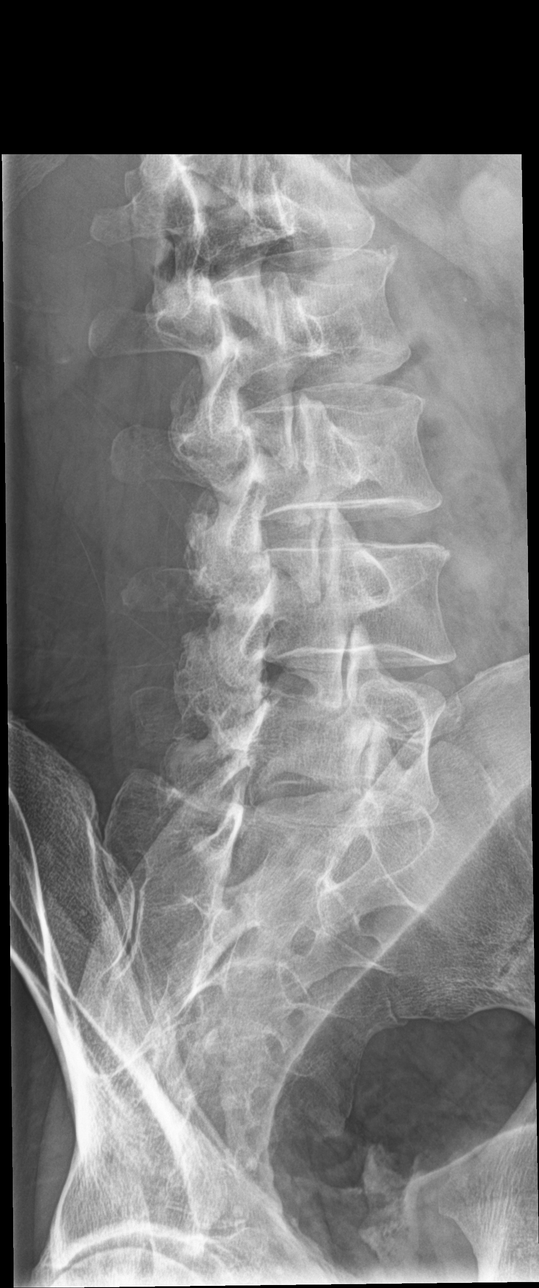

[l-spine obl (2 of 2)]
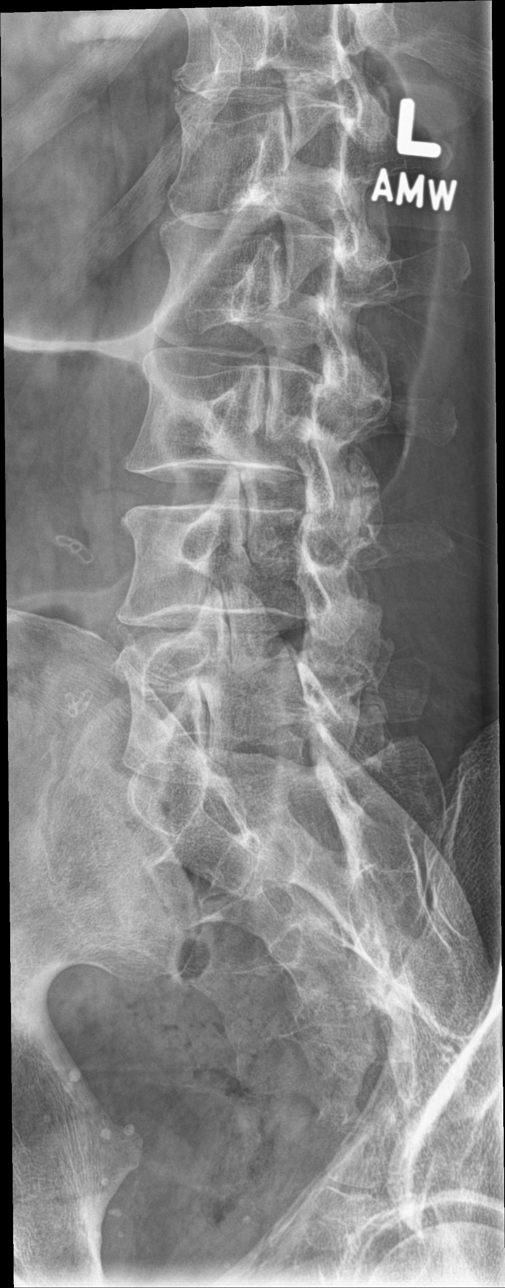

[l-spine lat]
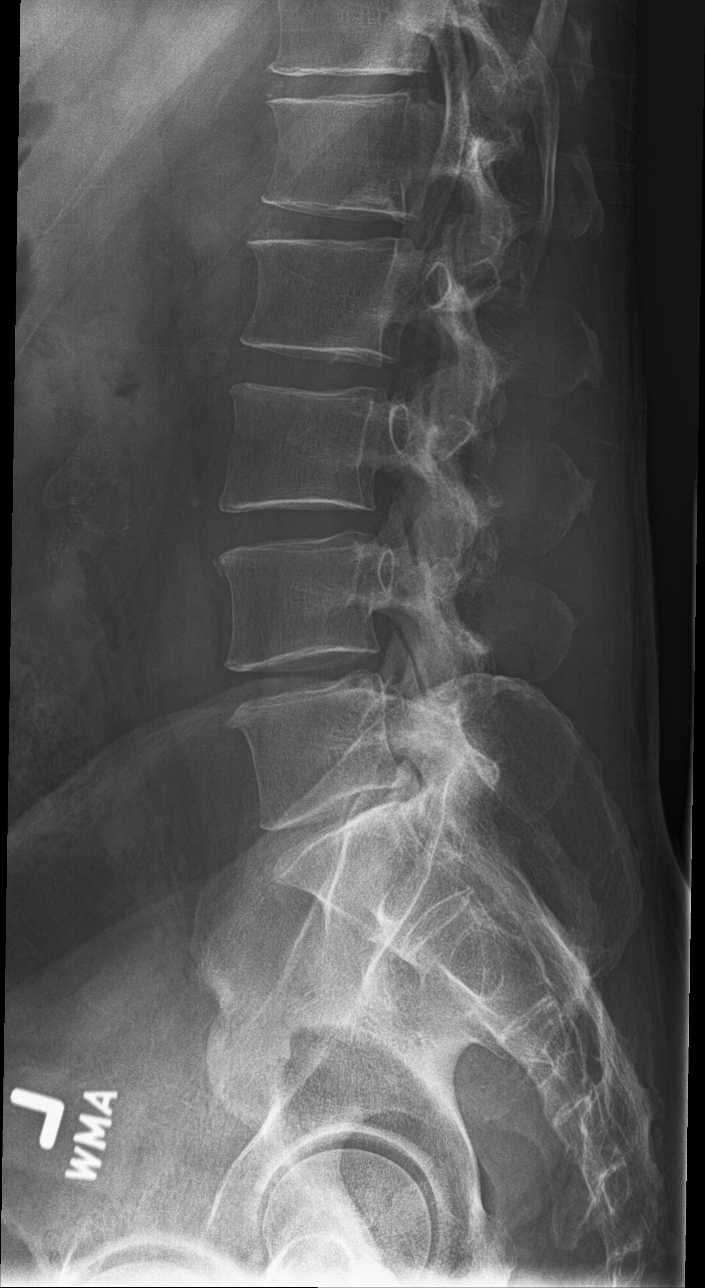

[l-spine spot]
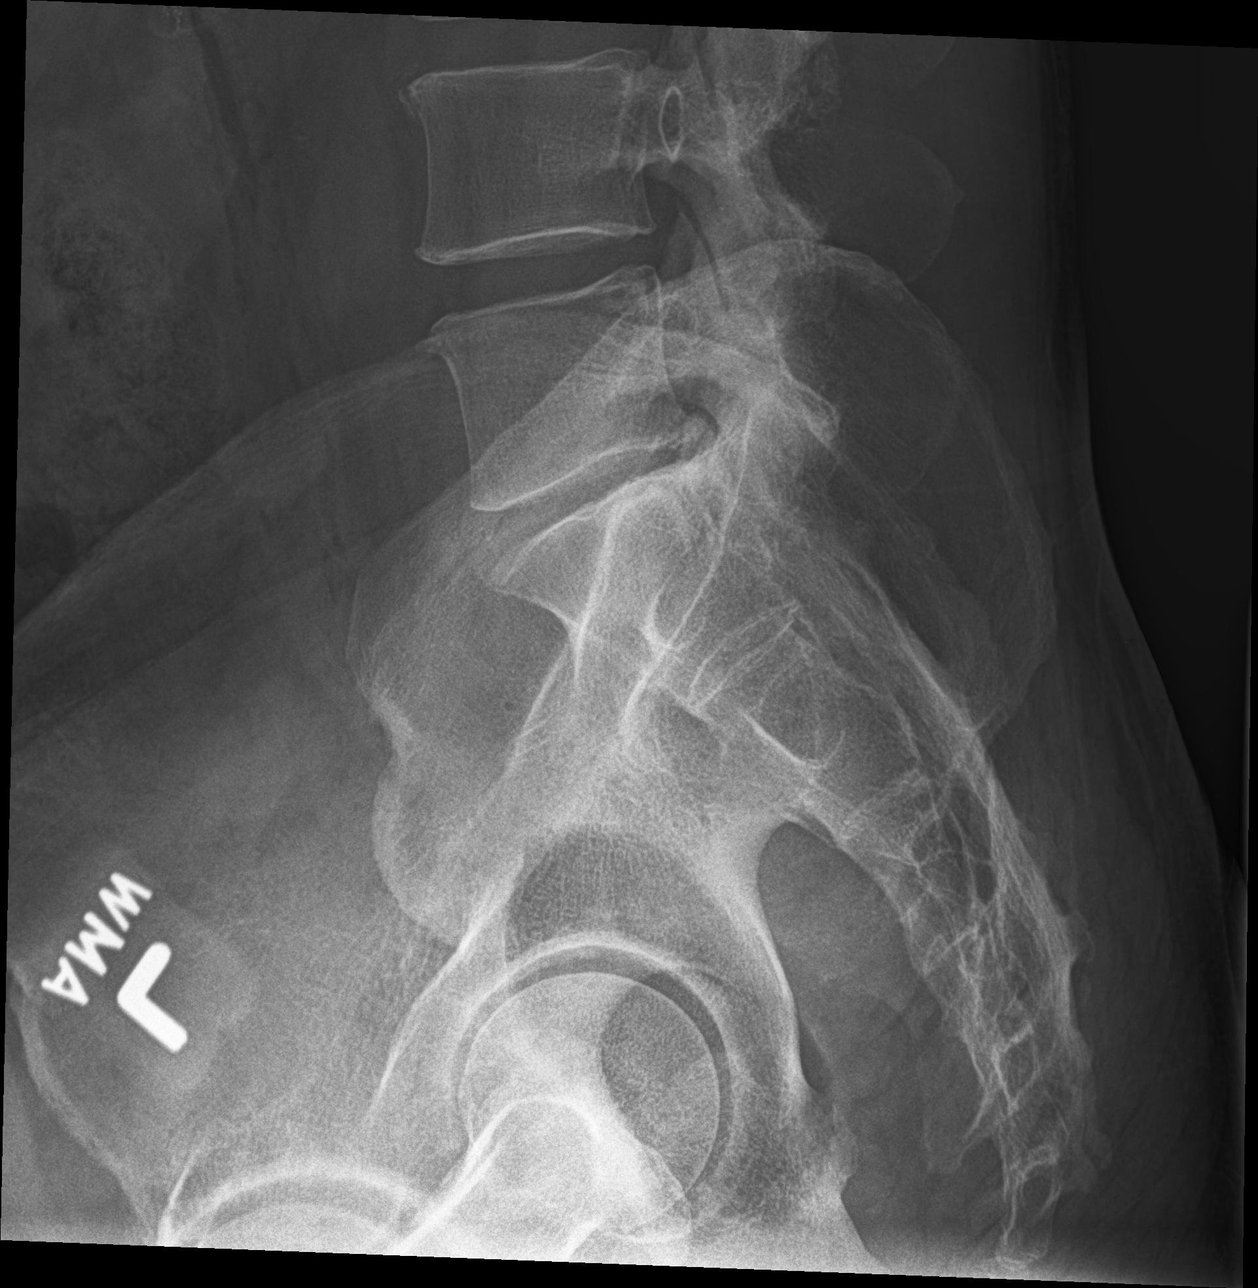

[5 of 5 positions shown; findings below may reference images not displayed]

FINDINGS: Five lumbar type vertebra. Slight straightening of normal lordosis.
No listhesis. Mild disc space narrowing at L5-S1. Minor endplate
spurring at L3-L4 and L4-L5. Minimal facet hypertrophy at L5-S1.
Vertebral body heights are normal. No evidence of fracture, pars
defects, or focal bone abnormality. Mild degenerative change of both
sacroiliac joints which are congruent.
IMPRESSION: 1. Mild degenerative disc disease and facet hypertrophy at L5-S1.
2. Additional spondylosis with endplate spurring at L3-L4 and L4-L5.
3. No acute findings.

## 2022-09-15 DIAGNOSIS — Z96641 Presence of right artificial hip joint: Secondary | ICD-10-CM | POA: Diagnosis not present

## 2022-09-19 ENCOUNTER — Other Ambulatory Visit: Payer: Self-pay

## 2022-09-19 MED ORDER — TRAMADOL HCL 50 MG PO TABS
50.0000 mg | ORAL_TABLET | Freq: Four times a day (QID) | ORAL | 0 refills | Status: DC | PRN
  Filled 2022-09-19: qty 20, 5d supply, fill #0

## 2022-09-19 MED ORDER — ASPIRIN 81 MG PO TBEC
81.0000 mg | DELAYED_RELEASE_TABLET | Freq: Two times a day (BID) | ORAL | 0 refills | Status: AC
  Filled 2022-09-19: qty 42, 21d supply, fill #0

## 2022-09-20 DIAGNOSIS — Z96641 Presence of right artificial hip joint: Secondary | ICD-10-CM | POA: Diagnosis not present

## 2022-10-17 ENCOUNTER — Ambulatory Visit: Payer: 59 | Attending: Cardiology

## 2022-10-17 DIAGNOSIS — R9431 Abnormal electrocardiogram [ECG] [EKG]: Secondary | ICD-10-CM

## 2022-10-17 DIAGNOSIS — I10 Essential (primary) hypertension: Secondary | ICD-10-CM | POA: Diagnosis not present

## 2022-10-17 DIAGNOSIS — Z01810 Encounter for preprocedural cardiovascular examination: Secondary | ICD-10-CM | POA: Diagnosis not present

## 2022-10-17 LAB — ECHOCARDIOGRAM COMPLETE

## 2022-10-18 DIAGNOSIS — Z96641 Presence of right artificial hip joint: Secondary | ICD-10-CM | POA: Diagnosis not present

## 2022-11-10 ENCOUNTER — Encounter: Payer: Self-pay | Admitting: Podiatry

## 2022-11-10 ENCOUNTER — Ambulatory Visit (INDEPENDENT_AMBULATORY_CARE_PROVIDER_SITE_OTHER): Payer: 59 | Admitting: Podiatry

## 2022-11-10 DIAGNOSIS — L600 Ingrowing nail: Secondary | ICD-10-CM | POA: Diagnosis not present

## 2022-11-17 NOTE — Progress Notes (Unsigned)
Referring Physician:  Reinaldo Berber, MD [address] [place],  [place] [address]  Primary Physician:  Duanne Limerick, MD  History of Present Illness: 11/22/2022 Mr. Cody [name] is here today with a chief complaint of left-sided anterior lateral thigh numbness and tingling.  He states that this has been shocklike and electricity feeling in nature.  He feels that this has been getting progressively worse.  He did have this prior to his contralateral hip replacement, he states that his hip pain was so significant at that point that he was not paying attention is much to the left sided symptoms.  Now that he has been in recovery and having a great outcome with his hip replacement he is able to notice the left side more.  He did have some previous left lower extremity neuropathic issues from an L5-S1 herniated disc and microdiscectomy/decompression.  He had a good outcome from that surgery   Review of Systems:  A 10 point review of systems is negative, except for the pertinent positives and negatives detailed in the HPI.  Past Medical History: Past Medical History:  Diagnosis Date   Anemia    Arthritis    Hypertension    Insomnia    a.) uses trazodone +/- diphenhydramine PRN sleep   Ventral hernia    a.) s/p repair 2005    Past Surgical History: Past Surgical History:  Procedure Laterality Date   APPENDECTOMY  2005   incidental at time of colon surgery.   BACK SURGERY  2000   COLON SURGERY  2005   Left colectomy for redundant colon with severe constipation.   COLONOSCOPY     COLONOSCOPY WITH PROPOFOL N/A 06/09/2020   Procedure: COLONOSCOPY WITH PROPOFOL;  Surgeon: Earline Mayotte, MD;  Location: [facility] ENDOSCOPY;  Service: Endoscopy;  Laterality: N/A;   HERNIA REPAIR  12/16/2003   ventral hernia repair with Compsix Kugel mesh   TOTAL HIP ARTHROPLASTY Right 09/04/2022   Procedure: TOTAL HIP ARTHROPLASTY ANTERIOR APPROACH;  Surgeon: Reinaldo Berber, MD;  Location: [facility]  ORS;  Service: Orthopedics;  Laterality: Right;    Allergies: Allergies as of 11/22/2022   (No Known Allergies)    Medications:  Current Outpatient Medications:    lisinopril (ZESTRIL) 5 MG tablet, Take 1 tablet (5 mg total) by mouth daily. (Patient not taking: Reported on 11/22/2022), Disp: 90 tablet, Rfl: 1   traZODone (DESYREL) 50 MG tablet, Take 0.5-1 tablets (25-50 mg total) by mouth at bedtime as needed for sleep. (Patient not taking: Reported on 11/22/2022), Disp: 30 tablet, Rfl: 3  Social History: Social History   Tobacco Use   Smoking status: Never   Smokeless tobacco: Never  Vaping Use   Vaping status: Never Used  Substance Use Topics   Alcohol use: Not Currently    Alcohol/week: 0.0 standard drinks of alcohol   Drug use: Never    Family Medical History: Family History  Problem Relation Age of Onset   Heart disease Father    Heart disease Paternal Grandfather     Physical Examination: Vitals:   11/22/22 1419  BP: (!) 130/90    General: Patient is in no apparent distress. Attention to examination is appropriate.  Neck:   Supple.  Full range of motion.  Respiratory: Patient is breathing without any difficulty.   NEUROLOGICAL:     Awake, alert, oriented to person, place, and time.  Speech is clear and fluent.   Cranial Nerves: Pupils equal round and reactive to light.  Facial  tone is symmetric. Shoulder shrug is symmetric. Tongue protrusion is midline.  There is no pronator drift.  Motor Exam:  Limited right lower extremity examination given his previous hip replacement.  His main complaint was on the left side, motor exam shows no evidence of weakness noted.  Reflexes are as are 1-2+ and equal bilaterally  Decreased sensation in the anterior lateral thigh on the left, this does extend somewhat more medial/anterior than the typical LF CN distribution, however it is not outside the realm of previous observations.  Tinel is negative.  Gait is normal.     Medical Decision Making  No new imaging for this appointment  I have personally reviewed the images and electrodiagnostics and agree with the above interpretation.  Assessment and Plan: Mr. Cody [name] is a pleasant 52 y.o. male with a history of right sided hip replacement which went very well, he had longstanding left anterior lateral thigh pain as well prior to this right sided exacerbation.  Once his hip was repaired he was able to again noticed his longstanding left-sided symptoms.  These follow mostly a LF CN distribution but are somewhat more anterior than standard.  He does not appear to have a positive straight leg raise or any hip provocative maneuvers on the left side.  He does not have significant back pain.  He did have a previous lumbar laminectomy and radiculopathy at L5-S1 for which he recovered well 20 years ago.  Given his symptoms I would like to evaluate him for meralgia paresthetica versus a L2 or L3 radiculopathy.  This would best be done with a updated MRI of his lumbar spine as he does have a predisposition to disc herniations, as well as a EMG/nerve conduction study of the left lower extremity specifically looking for meralgia paresthetica versus lumbar radiculopathy.  Would like to follow-up with him afterwards.  Imaging request and neurology referral has been placed.   Lovenia Kim MD/MSCR Neurosurgery - Peripheral Nerve Surgery

## 2022-11-22 ENCOUNTER — Ambulatory Visit (INDEPENDENT_AMBULATORY_CARE_PROVIDER_SITE_OTHER): Payer: 59 | Admitting: Neurosurgery

## 2022-11-22 ENCOUNTER — Encounter: Payer: Self-pay | Admitting: Neurosurgery

## 2022-11-22 VITALS — BP 130/90 | Ht 74.0 in | Wt 232.0 lb

## 2022-11-22 DIAGNOSIS — G5712 Meralgia paresthetica, left lower limb: Secondary | ICD-10-CM | POA: Diagnosis not present

## 2022-11-22 DIAGNOSIS — Z9889 Other specified postprocedural states: Secondary | ICD-10-CM | POA: Diagnosis not present

## 2022-11-22 DIAGNOSIS — G8929 Other chronic pain: Secondary | ICD-10-CM | POA: Diagnosis not present

## 2022-11-24 ENCOUNTER — Encounter: Payer: Self-pay | Admitting: Neurosurgery

## 2022-11-24 ENCOUNTER — Ambulatory Visit
Admission: RE | Admit: 2022-11-24 | Discharge: 2022-11-24 | Disposition: A | Discharge status: Home / Self Care | Payer: 59 | Source: Ambulatory Visit | Attending: Neurosurgery | Admitting: Neurosurgery

## 2022-11-24 DIAGNOSIS — M5416 Radiculopathy, lumbar region: Secondary | ICD-10-CM | POA: Diagnosis not present

## 2022-11-24 DIAGNOSIS — G8929 Other chronic pain: Secondary | ICD-10-CM | POA: Insufficient documentation

## 2022-11-24 DIAGNOSIS — M5126 Other intervertebral disc displacement, lumbar region: Secondary | ICD-10-CM | POA: Diagnosis not present

## 2022-11-24 DIAGNOSIS — R200 Anesthesia of skin: Secondary | ICD-10-CM | POA: Diagnosis not present

## 2022-11-24 DIAGNOSIS — M47816 Spondylosis without myelopathy or radiculopathy, lumbar region: Secondary | ICD-10-CM | POA: Diagnosis not present

## 2022-11-24 DIAGNOSIS — Z9889 Other specified postprocedural states: Secondary | ICD-10-CM | POA: Insufficient documentation

## 2022-11-24 DIAGNOSIS — M47817 Spondylosis without myelopathy or radiculopathy, lumbosacral region: Secondary | ICD-10-CM | POA: Diagnosis not present

## 2022-11-24 DIAGNOSIS — G5712 Meralgia paresthetica, left lower limb: Secondary | ICD-10-CM | POA: Insufficient documentation

## 2022-11-26 NOTE — Progress Notes (Signed)
   Chief Complaint  Patient presents with   Ingrown Toenail    Patient is here for bilateral removal of hallux nails    Subjective: Patient presents today for evaluation of pain with thickening and discoloration to the bilateral great toenails. Patient is concerned for possible ingrown nail.  It is very sensitive to touch.  Patient presents today for further treatment and evaluation.  Past Medical History:  Diagnosis Date   Anemia    Arthritis    Hypertension    Insomnia    a.) uses trazodone +/- diphenhydramine PRN sleep   Ventral hernia    a.) s/p repair 2005    Past Surgical History:  Procedure Laterality Date   APPENDECTOMY  2005   incidental at time of colon surgery.   BACK SURGERY  2000   COLON SURGERY  2005   Left colectomy for redundant colon with severe constipation.   COLONOSCOPY     COLONOSCOPY WITH PROPOFOL N/A 06/09/2020   Procedure: COLONOSCOPY WITH PROPOFOL;  Surgeon: Earline Mayotte, MD;  Location: [facility] ENDOSCOPY;  Service: Endoscopy;  Laterality: N/A;   HERNIA REPAIR  12/16/2003   ventral hernia repair with Compsix Kugel mesh   TOTAL HIP ARTHROPLASTY Right 09/04/2022   Procedure: TOTAL HIP ARTHROPLASTY ANTERIOR APPROACH;  Surgeon: Reinaldo Berber, MD;  Location: [facility] ORS;  Service: Orthopedics;  Laterality: Right;    No Known Allergies  Objective:  General: Well developed, nourished, in no acute distress, alert and oriented x3   Dermatology: Skin is warm, dry and supple bilateral.  Hyperkeratotic dystrophic nails noted to the bilateral great toes with sensitivity.  Vascular: DP and PT pulses palpable.  No clinical evidence of vascular compromise  Neruologic: Grossly intact via light touch bilateral.  Musculoskeletal: No pedal deformity noted  Assesement: #1  Symptomatic onychodystrophy/onychomycosis with ingrowing portion of toenails bilateral great toes  Plan of Care:  -Patient evaluated.  -Discussed treatment alternatives and plan of care.  Explained nail avulsion procedure and post procedure course to patient. -Patient opted for permanent partial nail avulsion of the ingrown portion of the nail.  -Prior to procedure, local anesthesia infiltration utilized using 3 ml of a 50:50 mixture of 2% plain lidocaine and 0.5% plain marcaine in a normal hallux block fashion and a betadine prep performed.  -Partial permanent nail avulsion with chemical matrixectomy performed using 3x30sec applications of phenol followed by alcohol flush.  -Light dressing applied.  Post care instructions provided -Return to clinic 3 weeks  Felecia Shelling, DPM [facility]  Dr. Felecia Shelling, DPM    [address] [place], [place] [address]                Office [phone]  Fax [phone]

## 2022-11-28 ENCOUNTER — Encounter: Payer: Self-pay | Admitting: Neurology

## 2022-11-28 ENCOUNTER — Other Ambulatory Visit: Payer: Self-pay

## 2022-11-28 DIAGNOSIS — R202 Paresthesia of skin: Secondary | ICD-10-CM

## 2022-12-01 ENCOUNTER — Other Ambulatory Visit: Payer: Self-pay | Admitting: Emergency Medicine

## 2022-12-01 ENCOUNTER — Other Ambulatory Visit: Payer: Self-pay

## 2022-12-01 ENCOUNTER — Encounter: Payer: Self-pay | Admitting: Cardiology

## 2022-12-01 ENCOUNTER — Ambulatory Visit: Payer: 59 | Attending: Cardiology | Admitting: Cardiology

## 2022-12-01 VITALS — BP 112/84 | HR 95 | Ht 74.0 in | Wt 233.2 lb

## 2022-12-01 DIAGNOSIS — I10 Essential (primary) hypertension: Secondary | ICD-10-CM

## 2022-12-01 DIAGNOSIS — E7800 Pure hypercholesterolemia, unspecified: Secondary | ICD-10-CM | POA: Diagnosis not present

## 2022-12-01 MED ORDER — ATORVASTATIN CALCIUM 40 MG PO TABS
40.0000 mg | ORAL_TABLET | Freq: Every day | ORAL | 3 refills | Status: DC
  Filled 2022-12-01: qty 90, 90d supply, fill #0

## 2022-12-01 NOTE — Patient Instructions (Signed)
Medication Instructions:  Your physician recommends the following medication changes.  START TAKING: Lipitor 40 mg daily  *If you need a refill on your cardiac medications before your next appointment, please call your pharmacy*   Lab Work: Your provider would like for you to have following labs drawn today LPA.   If you have labs (blood work) drawn today and your tests are completely normal, you will receive your results only by: MyChart Message (if you have MyChart) OR A paper copy in the mail If you have any lab test that is abnormal or we need to change your treatment, we will call you to review the results.  Your provider would like for you to return in 3 months to have the following labs drawn: Lipid Panel.   Please go to [facility] [address] ([facility] [address], [place] [address] You do not need an appointment.  They are open from 7:30 am-4 pm.  Lunch from 1:00 pm- 2:00 pm You DO need to be fasting.   You may also go to any of these [facility] locations:  [facility]  - [address] ([facility])  Follow-Up: At [facility], you and your health needs are our priority.  As part of our continuing mission to provide you with exceptional heart care, we have created designated Provider Care Teams.  These Care Teams include your primary Cardiologist (physician) and Advanced Practice Providers (APPs -  Physician Assistants and Nurse Practitioners) who all work together to provide you with the care you need, when you need it.  We recommend signing up for the patient portal called "MyChart".  Sign up information is provided on this After Visit Summary.  MyChart is used to connect with patients for Virtual Visits (Telemedicine).  Patients are able to view lab/test results, encounter notes, upcoming appointments, etc.  Non-urgent messages can be sent to your provider as well.   To learn more about what you can do with MyChart,  go to [email or web address].    Your next appointment:   3 month(s) after labs  Provider:   You may see Debbe Odea, MD or one of the following Advanced Practice Providers on your designated Care Team:   Nicolasa Ducking, NP Eula Listen, PA-C Cadence Fransico Michael, PA-C Charlsie Quest, NP

## 2022-12-01 NOTE — Progress Notes (Signed)
Cardiology Office Note:    Date:  12/01/2022   ID:  Cody [name], DOB Jul 11, 1970, MRN [id number]  PCP:  Duanne Limerick, MD   [facility] Providers Cardiologist:  Debbe Odea, MD     Referring MD: Duanne Limerick, MD   Chief Complaint  Patient presents with   Follow-up    Discuss cardiac testing results.  Patient denies new or acute cardiac problems/concerns today.      History of Present Illness:    Cody [name] is a 52 y.o. male with a hx of hypertension, hyperlipidemia presenting for follow-up.    Previously seen for preop evaluation and abnormal ECG showing nonspecific ST-T changes.  He was deemed low risk from a cardiac perspective.  Underwent right hip arthroplasty  successfully.  Denies chest pain or shortness of breath.  Was previously on lisinopril but developed a dry cough which resolved with stopping lisinopril.  Blood pressures have been well-controlled off BP meds.  Also trying to eat healthier.  Echocardiogram 8/24 showed normal systolic function EF 55 to 60%.  Past Medical History:  Diagnosis Date   Anemia    Arthritis    Hypertension    Insomnia    a.) uses trazodone +/- diphenhydramine PRN sleep   Ventral hernia    a.) s/p repair 2005    Past Surgical History:  Procedure Laterality Date   APPENDECTOMY  2005   incidental at time of colon surgery.   BACK SURGERY  2000   COLON SURGERY  2005   Left colectomy for redundant colon with severe constipation.   COLONOSCOPY     COLONOSCOPY WITH PROPOFOL N/A 06/09/2020   Procedure: COLONOSCOPY WITH PROPOFOL;  Surgeon: Earline Mayotte, MD;  Location: [facility] ENDOSCOPY;  Service: Endoscopy;  Laterality: N/A;   HERNIA REPAIR  12/16/2003   ventral hernia repair with Compsix Kugel mesh   TOTAL HIP ARTHROPLASTY Right 09/04/2022   Procedure: TOTAL HIP ARTHROPLASTY ANTERIOR APPROACH;  Surgeon: Reinaldo Berber, MD;  Location: [facility] ORS;  Service: Orthopedics;  Laterality: Right;    Current  Medications: Current Meds  Medication Sig   atorvastatin (LIPITOR) 40 MG tablet Take 1 tablet (40 mg total) by mouth daily.     Allergies:   Lisinopril   Social History   Socioeconomic History   Marital status: Divorced    Spouse name: Doni   Number of children: Not on file   Years of education: Not on file   Highest education level: Associate degree: occupational, Scientist, product/process development, or vocational program  Occupational History   Not on file  Tobacco Use   Smoking status: Never   Smokeless tobacco: Never  Vaping Use   Vaping status: Never Used  Substance and Sexual Activity   Alcohol use: Not Currently    Alcohol/week: 0.0 standard drinks of alcohol   Drug use: Never   Sexual activity: Yes    Partners: Female  Other Topics Concern   Not on file  Social History Narrative   ** Merged History Encounter **       Social Determinants of Health   Financial Resource Strain: Low Risk  (06/18/2022)   Overall Financial Resource Strain (CARDIA)    Difficulty of Paying Living Expenses: Not very hard  Food Insecurity: No Food Insecurity (09/04/2022)   Hunger Vital Sign    Worried About Running Out of Food in the Last Year: Never true    Ran Out of Food in the Last Year: Never true  Transportation Needs: No  Transportation Needs (09/04/2022)   PRAPARE - Administrator, Civil Service (Medical): No    Lack of Transportation (Non-Medical): No  Physical Activity: Insufficiently Active (06/18/2022)   Exercise Vital Sign    Days of Exercise per Week: 1 day    Minutes of Exercise per Session: 10 min  Stress: No Stress Concern Present (06/18/2022)   [employer] of Occupational Health - Occupational Stress Questionnaire    Feeling of Stress : Only a little  Social Connections: Socially Isolated (06/18/2022)   Social Connection and Isolation Panel [NHANES]    Frequency of Communication with Friends and Family: Once a week    Frequency of Social Gatherings with Friends and Family: Once  a week    Attends Religious Services: More than 4 times per year    Active Member of [employer] or Organizations: No    Attends Engineer, structural: Not on file    Marital Status: Divorced     Family History: The patient's family history includes Heart disease in his father and paternal grandfather.  ROS:   Please see the history of present illness.     All other systems reviewed and are negative.  EKGs/Labs/Other Studies Reviewed:    The following studies were reviewed today:       Recent Labs: 08/23/2022: ALT 20 09/05/2022: BUN 15; Creatinine, Ser 1.06; Hemoglobin 12.7; Platelets 265; Potassium 4.0; Sodium 136  Recent Lipid Panel    Component Value Date/Time   CHOL 264 (H) 06/22/2022 1038   TRIG 80 06/22/2022 1038   HDL 48 06/22/2022 1038   CHOLHDL 3.7 05/18/2017 0918   LDLCALC 203 (H) 06/22/2022 1038     Risk Assessment/Calculations:              Physical Exam:    VS:  BP 112/84 (BP Location: Left Arm, Patient Position: Sitting, Cuff Size: Normal)   Pulse 95   Ht 6\' 2"  (1.88 m)   Wt 233 lb 3.2 oz (105.8 kg)   SpO2 97%   BMI 29.94 kg/m     Wt Readings from Last 3 Encounters:  12/01/22 233 lb 3.2 oz (105.8 kg)  11/22/22 232 lb (105.2 kg)  09/04/22 219 lb 12.8 oz (99.7 kg)     GEN:  Well nourished, well developed in no acute distress HEENT: Normal NECK: No JVD; No carotid bruits CARDIAC: RRR, no murmurs, rubs, gallops RESPIRATORY:  Clear to auscultation without rales, wheezing or rhonchi  ABDOMEN: Soft, non-tender, non-distended MUSCULOSKELETAL:  No edema; No deformity  SKIN: Warm and dry NEUROLOGIC:  Alert and oriented x 3 PSYCHIATRIC:  Normal affect   ASSESSMENT:    1. Primary hypertension   2. Pure hypercholesterolemia    PLAN:    In order of problems listed above:  Hypertension, BP controlled off lisinopril.  Continue to monitor BP.  Low-salt diet advised. Hyperlipidemia, start Lipitor 40 mg daily.  LDL 203.  Obtain lipoprotein A.   Recheck lipid panel in 3 months.  Follow-up in 6 months.     Medication Adjustments/Labs and Tests Ordered: Current medicines are reviewed at length with the patient today.  Concerns regarding medicines are outlined above.  Orders Placed This Encounter  Procedures   Lipoprotein A (LPA)   Lipid panel   Meds ordered this encounter  Medications   atorvastatin (LIPITOR) 40 MG tablet    Sig: Take 1 tablet (40 mg total) by mouth daily.    Dispense:  90 tablet    Refill:  3  Patient Instructions  Medication Instructions:  Your physician recommends the following medication changes.  START TAKING: Lipitor 40 mg daily  *If you need a refill on your cardiac medications before your next appointment, please call your pharmacy*   Lab Work: Your provider would like for you to have following labs drawn today LPA.   If you have labs (blood work) drawn today and your tests are completely normal, you will receive your results only by: MyChart Message (if you have MyChart) OR A paper copy in the mail If you have any lab test that is abnormal or we need to change your treatment, we will call you to review the results.  Your provider would like for you to return in 3 months to have the following labs drawn: Lipid Panel.   Please go to [facility] [address] ([facility] [address], [place] [address] You do not need an appointment.  They are open from 7:30 am-4 pm.  Lunch from 1:00 pm- 2:00 pm You DO need to be fasting.   You may also go to any of these [facility] locations:  [facility]  - [address] ([facility])  Follow-Up: At [facility], you and your health needs are our priority.  As part of our continuing mission to provide you with exceptional heart care, we have created designated Provider Care Teams.  These Care Teams include your primary Cardiologist (physician) and Advanced Practice Providers (APPs -  Physician  Assistants and Nurse Practitioners) who all work together to provide you with the care you need, when you need it.  We recommend signing up for the patient portal called "MyChart".  Sign up information is provided on this After Visit Summary.  MyChart is used to connect with patients for Virtual Visits (Telemedicine).  Patients are able to view lab/test results, encounter notes, upcoming appointments, etc.  Non-urgent messages can be sent to your provider as well.   To learn more about what you can do with MyChart, go to [email or web address].    Your next appointment:   3 month(s) after labs  Provider:   You may see Debbe Odea, MD or one of the following Advanced Practice Providers on your designated Care Team:   Nicolasa Ducking, NP Eula Listen, PA-C Cadence Fransico Michael, PA-C Charlsie Quest, NP    Signed, Debbe Odea, MD  12/01/2022 3:30 PM    [facility]

## 2022-12-02 LAB — LIPOPROTEIN A (LPA): Lipoprotein (a): 51 nmol/L (ref <75.0)

## 2022-12-14 ENCOUNTER — Other Ambulatory Visit: Payer: Self-pay

## 2022-12-22 ENCOUNTER — Encounter: Payer: Self-pay | Admitting: Family Medicine

## 2022-12-22 ENCOUNTER — Ambulatory Visit (INDEPENDENT_AMBULATORY_CARE_PROVIDER_SITE_OTHER): Payer: 59 | Admitting: Family Medicine

## 2022-12-22 ENCOUNTER — Other Ambulatory Visit: Payer: Self-pay

## 2022-12-22 VITALS — BP 122/70 | HR 88 | Ht 74.0 in | Wt 232.0 lb

## 2022-12-22 DIAGNOSIS — Z0000 Encounter for general adult medical examination without abnormal findings: Secondary | ICD-10-CM | POA: Diagnosis not present

## 2022-12-22 DIAGNOSIS — R739 Hyperglycemia, unspecified: Secondary | ICD-10-CM | POA: Diagnosis not present

## 2022-12-22 DIAGNOSIS — N529 Male erectile dysfunction, unspecified: Secondary | ICD-10-CM | POA: Diagnosis not present

## 2022-12-22 MED ORDER — TADALAFIL 10 MG PO TABS
10.0000 mg | ORAL_TABLET | ORAL | 1 refills | Status: DC | PRN
Start: 2022-12-22 — End: 2023-04-18
  Filled 2022-12-22: qty 6, 30d supply, fill #0
  Filled 2023-01-17: qty 6, 30d supply, fill #1
  Filled 2023-02-26: qty 6, 30d supply, fill #2
  Filled 2023-04-18: qty 6, 30d supply, fill #3

## 2022-12-22 NOTE — Progress Notes (Signed)
Date:  12/22/2022   Name:  Cody [name]   DOB:  1970-09-05   MRN:  [id number]   Chief Complaint: Annual Exam  Patient is a 52 year old male who presents for a comprehensive physical exam. The patient reports the following problems: none. Health maintenance has been reviewed up to date.      Lab Results  Component Value Date   NA 136 09/05/2022   K 4.0 09/05/2022   CO2 21 (L) 09/05/2022   GLUCOSE 121 (H) 09/05/2022   BUN 15 09/05/2022   CREATININE 1.06 09/05/2022   CALCIUM 8.4 (L) 09/05/2022   EGFR 69 06/22/2022   GFRNONAA >60 09/05/2022   Lab Results  Component Value Date   CHOL 264 (H) 06/22/2022   HDL 48 06/22/2022   LDLCALC 203 (H) 06/22/2022   TRIG 80 06/22/2022   CHOLHDL 3.7 05/18/2017   Lab Results  Component Value Date   TSH 2.720 10/31/2017   No results found for: "HGBA1C" Lab Results  Component Value Date   WBC 14.4 (H) 09/05/2022   HGB 12.7 (L) 09/05/2022   HCT 37.1 (L) 09/05/2022   MCV 89.6 09/05/2022   PLT 265 09/05/2022   Lab Results  Component Value Date   ALT 20 08/23/2022   AST 24 08/23/2022   ALKPHOS 49 08/23/2022   BILITOT 0.9 08/23/2022   No results found for: "25OHVITD2", "25OHVITD3", "VD25OH"   Review of Systems  Constitutional:  Negative for chills and fever.  HENT:  Negative for drooling, ear discharge, ear pain, mouth sores, nosebleeds, postnasal drip, rhinorrhea, sinus pressure and sore throat.   Respiratory:  Positive for cough. Negative for shortness of breath and wheezing.   Cardiovascular:  Negative for chest pain, palpitations and leg swelling.  Gastrointestinal:  Negative for abdominal pain, blood in stool, constipation, diarrhea and nausea.  Endocrine: Negative for polydipsia.  Genitourinary:  Negative for dysuria, frequency, hematuria and urgency.  Musculoskeletal:  Negative for back pain, myalgias and neck pain.  Skin:  Negative for rash.  Allergic/Immunologic: Negative for environmental allergies.  Neurological:   Negative for dizziness, numbness and headaches.  Hematological:  Does not bruise/bleed easily.  Psychiatric/Behavioral:  Negative for suicidal ideas. The patient is not nervous/anxious.     Patient Active Problem List   Diagnosis Date Noted   Osteoarthritis of right hip 09/04/2022   Chronic right SI joint pain 05/09/2021   History of lumbar laminectomy 10/25/2020   Chronic radicular lumbar pain 10/25/2020   Neuropathic pain 10/25/2020   Lumbar facet arthropathy 10/25/2020   Chronic pain syndrome 10/25/2020   Piriformis syndrome, left 10/05/2020   Sacroiliac joint dysfunction of left side 10/04/2020   Back pain of lumbosacral region with sciatica 10/04/2020   Greater trochanteric bursitis, right 10/04/2020   Onychomycosis 09/12/2015    Allergies  Allergen Reactions   Lisinopril Cough    Past Surgical History:  Procedure Laterality Date   APPENDECTOMY  2005   incidental at time of colon surgery.   BACK SURGERY  2000   COLON SURGERY  2005   Left colectomy for redundant colon with severe constipation.   COLONOSCOPY     COLONOSCOPY WITH PROPOFOL N/A 06/09/2020   Procedure: COLONOSCOPY WITH PROPOFOL;  Surgeon: Earline Mayotte, MD;  Location: [facility];  Service: Endoscopy;  Laterality: N/A;   HERNIA REPAIR  12/16/2003   ventral hernia repair with Compsix Kugel mesh   TOTAL HIP ARTHROPLASTY Right 09/04/2022   Procedure: TOTAL HIP ARTHROPLASTY ANTERIOR APPROACH;  Surgeon: Reinaldo Berber, MD;  Location: [facility];  Service: Orthopedics;  Laterality: Right;    Social History   Tobacco Use   Smoking status: Never   Smokeless tobacco: Never  Vaping Use   Vaping status: Never Used  Substance Use Topics   Alcohol use: Not Currently    Alcohol/week: 0.0 standard drinks of alcohol   Drug use: Never     Medication list has been reviewed and updated.  No outpatient medications have been marked as taking for the 12/22/22 encounter (Office Visit) with Duanne Limerick, MD.        12/22/2022   10:49 AM 07/20/2022    1:34 PM 06/22/2022    9:45 AM 05/09/2022    9:46 AM  GAD 7 : Generalized Anxiety Score  Nervous, Anxious, on Edge 0 0 0 0  Control/stop worrying 0 0 0 0  Worry too much - different things 0 0 0 0  Trouble relaxing 0 0 0 0  Restless 0 0 0 0  Easily annoyed or irritable 0 0 0 0  Afraid - awful might happen 0 0 0 0  Total GAD 7 Score 0 0 0 0  Anxiety Difficulty Not difficult at all Not difficult at all Not difficult at all Not difficult at all       12/22/2022   10:49 AM 07/20/2022    1:34 PM 06/22/2022    9:44 AM  Depression screen PHQ 2/9  Decreased Interest 0 0 0  Down, Depressed, Hopeless 0 0 0  PHQ - 2 Score 0 0 0  Altered sleeping 3 0 0  Tired, decreased energy 0 0 0  Change in appetite 1 0 0  Feeling bad or failure about yourself  0 0 0  Trouble concentrating 0 0 0  Moving slowly or fidgety/restless 0 0 0  Suicidal thoughts 0 0 0  PHQ-9 Score 4 0 0  Difficult doing work/chores Not difficult at all Not difficult at all Not difficult at all    BP Readings from Last 3 Encounters:  12/22/22 122/70  12/01/22 112/84  11/22/22 (!) 130/90    Physical Exam Vitals and nursing note reviewed.  Constitutional:      Appearance: He is well-groomed.  HENT:     Head: Normocephalic.     Jaw: There is normal jaw occlusion.     Right Ear: Tympanic membrane and external ear normal. No tenderness. There is no impacted cerumen.     Left Ear: Tympanic membrane and external ear normal. No tenderness. There is no impacted cerumen.     Nose: Nose normal. No congestion or rhinorrhea.     Right Turbinates: Not enlarged, swollen or pale.     Left Turbinates: Not enlarged, swollen or pale.     Mouth/Throat:     Lips: Pink.     Mouth: Mucous membranes are moist.     Tongue: No lesions.     Palate: No mass.     Pharynx: Oropharynx is clear. Uvula midline.  Eyes:     General: Lids are normal. Vision grossly intact. No scleral icterus.        Right eye: No discharge.        Left eye: No discharge.     Extraocular Movements:     Right eye: Normal extraocular motion and no nystagmus.     Left eye: Normal extraocular motion and no nystagmus.     Conjunctiva/sclera: Conjunctivae normal.     Pupils: Pupils are equal, round,  and reactive to light.  Neck:     Thyroid: No thyroid mass, thyromegaly or thyroid tenderness.     Vascular: Normal carotid pulses. No carotid bruit, hepatojugular reflux or JVD.     Trachea: Trachea and phonation normal. No tracheal deviation.  Cardiovascular:     Rate and Rhythm: Normal rate and regular rhythm.     Heart sounds: Normal heart sounds, S1 normal and S2 normal. No murmur heard.    No systolic murmur is present.     No diastolic murmur is present.     No friction rub. No gallop. No S3 or S4 sounds.  Pulmonary:     Effort: No respiratory distress.     Breath sounds: Normal breath sounds. No decreased breath sounds, wheezing, rhonchi or rales.  Chest:  Breasts:    Right: Absent. No tenderness.     Left: Absent. No tenderness.  Abdominal:     General: Bowel sounds are normal.     Palpations: Abdomen is soft. There is no hepatomegaly, splenomegaly or mass.     Tenderness: There is no abdominal tenderness. There is no guarding or rebound.  Genitourinary:    Testes: Normal.        Right: Mass not present.        Left: Mass not present.     Epididymis:     Right: Normal.     Left: Normal.     Prostate: Normal. Not enlarged and not tender.     Rectum: Normal. Guaiac result negative. No mass.  Musculoskeletal:        General: No tenderness. Normal range of motion.     Cervical back: Full passive range of motion without pain, normal range of motion and neck supple.     Right lower leg: No edema.     Left lower leg: No edema.  Lymphadenopathy:     Head:     Right side of head: No submental adenopathy.     Left side of head: No submental adenopathy.     Cervical: No cervical adenopathy.      Right cervical: No superficial, deep or posterior cervical adenopathy.    Left cervical: No superficial, deep or posterior cervical adenopathy.     Upper Body:     Right upper body: No supraclavicular adenopathy.     Left upper body: No supraclavicular adenopathy.  Skin:    General: Skin is warm.     Capillary Refill: Capillary refill takes less than 2 seconds.     Findings: No rash.  Neurological:     Mental Status: He is alert and oriented to person, place, and time.     Cranial Nerves: Cranial nerves 2-12 are intact. No cranial nerve deficit.     Sensory: Sensation is intact.     Motor: Motor function is intact.     Deep Tendon Reflexes: Reflexes are normal and symmetric.  Psychiatric:        Behavior: Behavior is cooperative.     Wt Readings from Last 3 Encounters:  12/22/22 232 lb (105.2 kg)  12/01/22 233 lb 3.2 oz (105.8 kg)  11/22/22 232 lb (105.2 kg)    BP 122/70   Pulse 88   Ht 6\' 2"  (1.88 m)   Wt 232 lb (105.2 kg)   SpO2 98%   BMI 29.79 kg/m   Assessment and Plan: 1. Annual physical exam No subjective/objective concerns noted during HPI, review of past medical history/medications/labs, review of systems and physical exam.  Review  of renal panel/CMP and CBC from previous surgery is acceptable at this time.  At this time we will check his lipid panel and A1c.  2. Erectile dysfunction, unspecified erectile dysfunction type Patient desires to have a trial of Cialis 10 mg I will provide 10 tablets to take on as-needed basis with 1 refill if patient is tolerating he can call and I will send more refills. - tadalafil (CIALIS) 10 MG tablet; Take 1 tablet (10 mg total) by mouth every other day as needed for erectile dysfunction.  Dispense: 10 tablet; Refill: 1  3. Hyperglycemia Noted present elevation of glucoses in the past we will check A1c to see if we are emerging into a prediabetic concern. - Hemoglobin A1c - Lipid Panel With LDL/HDL Ratio     [place] Sauer, MD

## 2022-12-23 ENCOUNTER — Encounter: Payer: Self-pay | Admitting: Family Medicine

## 2022-12-23 LAB — LIPID PANEL WITH LDL/HDL RATIO
Cholesterol, Total: 255 mg/dL — ABNORMAL HIGH (ref 100–199)
HDL: 44 mg/dL (ref >39)
LDL Chol Calc (NIH): 191 mg/dL — ABNORMAL HIGH (ref 0–99)
LDL/HDL Ratio: 4.3 ratio — ABNORMAL HIGH (ref 0.0–3.6)
Triglycerides: 112 mg/dL (ref 0–149)
VLDL Cholesterol Cal: 20 mg/dL (ref 5–40)

## 2022-12-23 LAB — HEMOGLOBIN A1C
Est. average glucose Bld gHb Est-mCnc: 123 mg/dL
Hgb A1c MFr Bld: 5.9 % — ABNORMAL HIGH (ref 4.8–5.6)

## 2023-01-08 ENCOUNTER — Ambulatory Visit: Payer: 59 | Admitting: Neurology

## 2023-01-08 DIAGNOSIS — R202 Paresthesia of skin: Secondary | ICD-10-CM | POA: Diagnosis not present

## 2023-01-08 DIAGNOSIS — M5417 Radiculopathy, lumbosacral region: Secondary | ICD-10-CM

## 2023-01-08 NOTE — Procedures (Addendum)
[facility]  [address] [place] [address], [address]  [place], [place] [address] Tel: [phone] Fax: [phone] Test Date:  01/08/2023  Patient: Cody [name] DOB: 1970/08/29 Physician: Jacquelyne Balint, MD  Sex: Male Height: 6\' 2"  Ref Phys: Ernestine Mcmurray, MD  ID#: [id number]   Technician:    History: This is a 52 year old male with left anterior thigh numbness and tingling.  NCV & EMG Findings: Extensive electrodiagnostic evaluation of the left lower limb with additional nerve conduction studies of the right lower limb shows: Left sural and superficial peroneal/fibular sensory responses are within normal limits. Unable to obtain sensory response from left or right lateral femoral cutaneous nerve. Left peroneal/fibular (EDB) and tibial (AH) motor responses are within normal limits. Left H reflex latency is prolonged at 37.8 ms, but is normal for patient's height. Chronic motor axon loss changes without accompanying active denervation changes are seen in the left tibialis anterior, flexor digitorum longus, medial head of gastrocnemius, short head of biceps femoris, rectus femoris, and adductor longus muscles. Lumbosacral paraspinal muscles were deferred due to prior lumbosacral spine surgery.   Neuromuscular ultrasound: High frequency (4.0-16.0 MHz) B-mode, nonvascular ultrasound of lateral femoral cutaneous nerve of the right and left lower limb at the inguinal ligament shows normal and symmetric cross-sectional areas. No other obvious lesion involving the adjacent bone or tendon is identified. No definite vascular abnormalities.  Impression: This is an abnormal study. 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 No definite electrodiagnostic evidence of a left lateral femoral cutaneous neuropathy. Absent bilateral  lateral femoral cutaneous sensory responses are likely technical in nature. There is no asymmetry of the nerves at the inguinal ligament to suggest swelling of the left lateral femoral cutaneous nerve.   ___________________________ Jacquelyne Balint, MD    NCS+ Motor Nerve Results    Latency Amplitude F-Lat Segment Distance CV Comment  Site (ms) Norm (mV) Norm (ms)  (cm) (m/s) Norm   Left Fibular (EDB) Motor  Ankle 3.5  < 6.0 4.9  > 2.5        Bel fib head 12.0 - 3.7 -  Bel fib head-Ankle 36 42  > 40   Pop fossa 14.4 - 3.4 -  Pop fossa-Bel fib head 10 42 -   Left Tibial (AH) Motor  Ankle 3.6  < 6.0 11.2  > 4.0        Knee 14.7 - 7.1 -  Knee-Ankle 47 42  > 40    Sensory Sites    Neg Peak Lat Amplitude (O-P) Segment Distance Velocity Comment  Site (ms) Norm (V) Norm  (cm) (ms)   Left Lateral Femoral Cutaneous Sensory  ASIS-Lat thigh *NR  < 2.6 *NR  > 4 ASIS-Lat thigh 12    Right Lateral Femoral Cutaneous Sensory  ASIS-Lat thigh *NR  < 2.6 *NR  > 4 ASIS-Lat thigh 12    Left Superficial Fibular Sensory  14 cm-Ankle 3.1  < 4.6 6  > 4 14 cm-Ankle 14    Left Sural Sensory  Calf-Lat mall 4.3  < 4.6 7  > 4 Calf-Lat mall 14     H-Reflex Results    M-Lat H Lat H Neg Amp H-M Lat  Site (ms) (ms) Norm (mV) (ms)  Left Tibial H-Reflex  Pop fossa 6.8 *37.8  < 35.0 0.83 31.0   EMG+  Side Muscle Ins.Act Fibs Fasc Recrt Amp Dur Poly Activation Comment  Left Tib ant Nml Nml Nml *1- *1+ *1+ Nml Nml N/A  Left Gastroc MH Nml Nml Nml *1- *1+ *1+ Nml Nml N/A  Left FDL Nml Nml Nml *2- *1+ *1+ Nml Nml N/A  Left Rectus fem Nml Nml Nml *2- *2+ *2+ *1+ Nml N/A  Left Add longus Nml Nml Nml *1- *1+ *1+ Nml Nml N/A  Left Biceps fem SH Nml Nml Nml *1- *1+ *1+ Nml Nml N/A  Left Gluteus med Nml Nml Nml Nml Nml Nml Nml Nml N/A   Nerve Measurements   Site Area Mobility Vascularity Comment   mm Norm     Left Lateral femoral cutaneous   1.7      Right Lateral femoral cutaneous   1.6           Waveforms:  Motor      Sensory           H-Reflex

## 2023-01-29 ENCOUNTER — Ambulatory Visit: Payer: 59 | Admitting: Neurosurgery

## 2023-01-29 DIAGNOSIS — G5712 Meralgia paresthetica, left lower limb: Secondary | ICD-10-CM

## 2023-01-29 DIAGNOSIS — M545 Low back pain, unspecified: Secondary | ICD-10-CM

## 2023-01-29 NOTE — Progress Notes (Signed)
I had a follow-up phone call today with [name].  He was at home and I was in the office.  He gave consent to go forward with phone visit.  We discussed his nerve conduction study.  In summary it showed expected lumbosacral issues that were likely intracanalicular and secondary to his previous spine issues.  There was no evidence of a new or active radiculopathy.  In regards to the LFCN region issues there was no electrodiagnostic evidence of meralgia paresthetica.  We did discuss that for further evaluation we could get a diagnostic block at the inguinal ligament.  He states that this has not been bothering him significantly and he would like to hold off on any further workup.  He plans to reach out to [place] should he get any worse.  We spent 8 minutes on the phone call.  Lovenia Kim, MD

## 2023-03-12 ENCOUNTER — Ambulatory Visit: Payer: 59 | Admitting: Cardiology

## 2023-04-18 ENCOUNTER — Other Ambulatory Visit: Payer: Self-pay | Admitting: Family Medicine

## 2023-04-18 ENCOUNTER — Other Ambulatory Visit: Payer: Self-pay

## 2023-04-18 DIAGNOSIS — N529 Male erectile dysfunction, unspecified: Secondary | ICD-10-CM

## 2023-04-18 MED ORDER — TADALAFIL 10 MG PO TABS
10.0000 mg | ORAL_TABLET | ORAL | 1 refills | Status: AC | PRN
Start: 2023-04-18 — End: ?
  Filled 2023-04-18: qty 6, 30d supply, fill #0
  Filled 2023-06-11: qty 6, 30d supply, fill #1
  Filled 2023-07-26: qty 6, 30d supply, fill #2
  Filled 2023-10-19: qty 6, 30d supply, fill #3

## 2023-05-18 ENCOUNTER — Ambulatory Visit (INDEPENDENT_AMBULATORY_CARE_PROVIDER_SITE_OTHER)

## 2023-05-18 ENCOUNTER — Other Ambulatory Visit: Payer: Self-pay

## 2023-05-18 ENCOUNTER — Encounter: Payer: Self-pay | Admitting: Podiatry

## 2023-05-18 ENCOUNTER — Ambulatory Visit: Admitting: Podiatry

## 2023-05-18 DIAGNOSIS — M2022 Hallux rigidus, left foot: Secondary | ICD-10-CM | POA: Diagnosis not present

## 2023-05-18 DIAGNOSIS — M7752 Other enthesopathy of left foot: Secondary | ICD-10-CM

## 2023-05-18 MED ORDER — METHYLPREDNISOLONE 4 MG PO TBPK
ORAL_TABLET | ORAL | 0 refills | Status: DC
  Filled 2023-05-18: qty 21, 6d supply, fill #0

## 2023-05-18 MED ORDER — DOXYCYCLINE HYCLATE 100 MG PO TABS
100.0000 mg | ORAL_TABLET | Freq: Two times a day (BID) | ORAL | 0 refills | Status: DC
  Filled 2023-05-18: qty 20, 10d supply, fill #0
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
## 2023-05-28 ENCOUNTER — Telehealth: Payer: Self-pay | Admitting: Podiatry

## 2023-05-28 NOTE — Telephone Encounter (Signed)
 Pt came in today to [facility] and wanted [place] to let Dr Logan Bores know that his tx from last visit did not work. Pt doesn't know if he needs to get something else sent over to pharmacy or not.

## 2023-06-01 ENCOUNTER — Encounter: Payer: Self-pay | Admitting: Podiatry

## 2023-06-01 ENCOUNTER — Ambulatory Visit (INDEPENDENT_AMBULATORY_CARE_PROVIDER_SITE_OTHER): Admitting: Podiatry

## 2023-06-01 DIAGNOSIS — M7752 Other enthesopathy of left foot: Secondary | ICD-10-CM | POA: Diagnosis not present

## 2023-06-01 NOTE — Progress Notes (Signed)
 Cortisone shot IPJ left great toe.     Chief Complaint  Patient presents with   Foot Pain    Capsulitis of big left toe, follow up still pain in foot    HPI: 53 y.o. male presenting today for follow-up evaluation of pain and tenderness associated left great toe.  He says that there has been no improvement since last visit.  Presenting for further treatment evaluation  Past Medical History:  Diagnosis Date   Anemia    Arthritis    Hypertension    Insomnia    a.) uses trazodone  +/- diphenhydramine PRN sleep   Ventral hernia    a.) s/p repair 2005    Past Surgical History:  Procedure Laterality Date   APPENDECTOMY  2005   incidental at time of colon surgery.   BACK SURGERY  2000   COLON SURGERY  2005   Left colectomy for redundant colon with severe constipation.   COLONOSCOPY     COLONOSCOPY WITH PROPOFOL  N/A 06/09/2020   Procedure: COLONOSCOPY WITH PROPOFOL ;  Surgeon: Marshall Skeeter, MD;  Location: [facility];  Service: Endoscopy;  Laterality: N/A;   HERNIA REPAIR  12/16/2003   ventral hernia repair with Compsix Kugel mesh   TOTAL HIP ARTHROPLASTY Right 09/04/2022   Procedure: TOTAL HIP ARTHROPLASTY ANTERIOR APPROACH;  Surgeon: Venus Ginsberg, MD;  Location: [facility];  Service: Orthopedics;  Laterality: Right;    Allergies  Allergen Reactions   Lisinopril  Cough     Physical Exam: General: The patient is alert and oriented x3 in no acute distress.  Dermatology: Skin is warm, dry and supple bilateral lower extremities.  No open wounds  Vascular: Palpable pedal pulses bilaterally. Capillary refill within normal limits.  Persistent edema noted to the left great toe.  No erythema today  Neurological: Grossly intact via light touch  Musculoskeletal Exam: Tenderness and enlargement noted to the IPJ of the left great toe  Radiographic Exam LT foot 10/18/2023:  Normal osseous mineralization.  Degenerative change is noted to the IPJ.  There does appear to be some  surrounding soft tissue swelling.  No gas within the tissues.  No acute irregularities concerning for acute fracture or osteomyelitis  Assessment/Plan of Care: 1.  Capsulitis left great toe  -Patient evaluated.   -Injection of 0.5 cc Celestone  Soluspan injected in the IPJ left great toe -Continue wearing wide shoes that do not irritate or constrict the toebox area -Return to clinic 4 weeks       Dot Gazella, DPM [facility]  Dr. Dot Gazella, DPM    [address] [place], [place] [address]                Office [phone]  Fax [phone]

## 2023-06-19 DIAGNOSIS — M7752 Other enthesopathy of left foot: Secondary | ICD-10-CM | POA: Diagnosis not present

## 2023-06-19 MED ORDER — BETAMETHASONE SOD PHOS & ACET 6 (3-3) MG/ML IJ SUSP
3.0000 mg | Freq: Once | INTRAMUSCULAR | Status: AC
Start: 2023-06-19 — End: 2023-06-19
  Administered 2023-06-19: 3 mg via INTRA_ARTICULAR

## 2023-10-19 ENCOUNTER — Other Ambulatory Visit: Payer: Self-pay | Admitting: Internal Medicine

## 2023-10-19 ENCOUNTER — Other Ambulatory Visit: Payer: Self-pay

## 2023-10-19 DIAGNOSIS — N529 Male erectile dysfunction, unspecified: Secondary | ICD-10-CM

## 2023-10-22 ENCOUNTER — Other Ambulatory Visit: Payer: Self-pay

## 2023-10-22 NOTE — Telephone Encounter (Signed)
 Requested medication (s) are due for refill today: yes  Requested medication (s) are on the active medication list: yes  Last refill:  04/17/26  Future visit scheduled: no  Notes to clinic:  provider no longer at this practice, routing for review     Requested Prescriptions  Pending Prescriptions Disp Refills   tadalafil  (CIALIS ) 10 MG tablet 10 tablet 1    Sig: Take 1 tablet (10 mg total) by mouth every other day as needed for erectile dysfunction.     Urology: Erectile Dysfunction Agents Failed - 10/22/2023  8:43 AM      Failed - AST in normal range and within 360 days    AST  Date Value Ref Range Status  08/23/2022 24 15 - 41 U/L Final         Failed - ALT in normal range and within 360 days    ALT  Date Value Ref Range Status  08/23/2022 20 0 - 44 U/L Final         Failed - Valid encounter within last 12 months    Recent Outpatient Visits   None            Passed - Last BP in normal range    BP Readings from Last 1 Encounters:  12/22/22 122/70

## 2024-02-04 DIAGNOSIS — H5213 Myopia, bilateral: Secondary | ICD-10-CM | POA: Diagnosis not present

## 2024-02-04 DIAGNOSIS — H40029 Open angle with borderline findings, high risk, unspecified eye: Secondary | ICD-10-CM | POA: Diagnosis not present

## 2024-02-15 ENCOUNTER — Encounter: Payer: Self-pay | Admitting: Emergency Medicine

## 2024-02-15 ENCOUNTER — Ambulatory Visit
Admission: EM | Admit: 2024-02-15 | Discharge: 2024-02-15 | Disposition: A | Discharge status: Home / Self Care | Attending: Emergency Medicine | Admitting: Emergency Medicine

## 2024-02-15 DIAGNOSIS — B349 Viral infection, unspecified: Secondary | ICD-10-CM

## 2024-02-15 DIAGNOSIS — J101 Influenza due to other identified influenza virus with other respiratory manifestations: Secondary | ICD-10-CM | POA: Diagnosis not present

## 2024-02-15 LAB — POC COVID19/FLU A&B COMBO
Covid Antigen, POC: NEGATIVE
Influenza A Antigen, POC: NEGATIVE
Influenza B Antigen, POC: POSITIVE — AB

## 2024-02-15 LAB — POCT RAPID STREP A (OFFICE): Rapid Strep A Screen: NEGATIVE

## 2024-02-15 MED ORDER — PREDNISONE 10 MG PO TABS
ORAL_TABLET | ORAL | 0 refills | Status: AC
  Filled 2024-02-15: qty 21, 6d supply, fill #0

## 2024-02-15 MED ORDER — LIDOCAINE VISCOUS HCL 2 % MT SOLN
15.0000 mL | OROMUCOSAL | 0 refills | Status: AC | PRN
  Filled 2024-02-15: qty 100, 1d supply, fill #0

## 2024-02-15 MED ORDER — OSELTAMIVIR PHOSPHATE 75 MG PO CAPS
75.0000 mg | ORAL_CAPSULE | Freq: Two times a day (BID) | ORAL | 0 refills | Status: AC
  Filled 2024-02-15: qty 10, 5d supply, fill #0

## 2024-02-15 NOTE — Discharge Instructions (Addendum)
 You have tested positive for the flu which is a virus and should steadily improve with time,  You may take Tamiflu twice daily for the next 5 days, if you are able to get this medication from the pharmacy then continue supportive treatment, symptoms will improve as the virus works as well after system whether or not you take this medication  Begin prednisone  every morning with food to help with irritation, avoid ibuprofen May use Tylenol   May gargle and spit lidocaine  solution every 4 hours as needed for comfort  You can take Tylenol  as needed for fever reduction and pain relief.   For cough: honey 1/2 to 1 teaspoon (you can dilute the honey in water or another fluid).  You can also use guaifenesin and dextromethorphan for cough. You can use a humidifier for chest congestion and cough.  If you don't have a humidifier, you can sit in the bathroom with the hot shower running.      For sore throat: try warm salt water gargles, cepacol lozenges, throat spray, warm tea or water with lemon/honey, popsicles or ice, or OTC cold relief medicine for throat discomfort.   For congestion: take a daily anti-histamine like Zyrtec, Claritin, and a oral decongestant, such as pseudoephedrine.  You can also use Flonase 1-2 sprays in each nostril daily.   It is important to stay hydrated: drink plenty of fluids (water, gatorade/powerade/pedialyte, juices, or teas) to keep your throat moisturized and help further relieve irritation/discomfort.

## 2024-02-15 NOTE — ED Triage Notes (Signed)
 Patient complains of fever, body aches, chills and sore throat x 3 days. Patient has taken Ibuprofen with mild relief.  Rates body aches 2/10 and sore throat 8/10.

## 2024-02-15 NOTE — ED Provider Notes (Signed)
 " Cody [name]    CSN: [id number] Arrival date & time: 02/15/24  1228      History   Chief Complaint Chief Complaint  Patient presents with   Sore Throat   Fever   Chills   Generalized Body Aches    HPI Cody [name] is a 53 y.o. male.   Patient presents for evaluation of a fever peaking at 101.7, chills, body aches, sore throat, bilateral ear pain and pressure beginning 3 days ago.  Nasal congestion beginning today with a very mild nonproductive cough.  Decreased intake due to pain with swallowing.  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  Has attempted use of ibuprofen and NyQuil severe.   Past Medical History:  Diagnosis Date   Anemia    Arthritis    Hypertension    Insomnia    a.) uses trazodone  +/- diphenhydramine PRN sleep   Ventral hernia    a.) s/p repair 2005    Patient Active Problem List   Diagnosis Date Noted   Osteoarthritis of right hip 09/04/2022   Chronic right SI joint pain 05/09/2021   History of lumbar laminectomy 10/25/2020   Chronic radicular lumbar pain 10/25/2020   Neuropathic pain 10/25/2020   Lumbar facet arthropathy 10/25/2020   Chronic pain syndrome 10/25/2020   Piriformis syndrome, left 10/05/2020   Sacroiliac joint dysfunction of left side 10/04/2020   Back pain of lumbosacral region with sciatica 10/04/2020   Greater trochanteric bursitis, right 10/04/2020   Onychomycosis 09/12/2015    Past Surgical History:  Procedure Laterality Date   APPENDECTOMY  2005   incidental at time of colon surgery.   BACK SURGERY  2000   COLON SURGERY  2005   Left colectomy for redundant colon with severe constipation.   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 Authorizing Provider  tadalafil  (CIALIS ) 10 MG tablet Take 1 tablet (10 mg total) by mouth every other day as needed for erectile dysfunction. 04/18/23   Joshua Cathryne BROCKS, MD    Family History Family History  Problem Relation Age of Onset   Heart disease Father    Heart disease Paternal Grandfather     Social History Social History[1]   Allergies   Lisinopril    Review of Systems Review of Systems  Constitutional:  Positive for fever. Negative for activity change, appetite change, chills, diaphoresis, fatigue and unexpected weight change.  HENT:  Positive for congestion, ear pain and sore throat. Negative for dental problem, drooling, ear discharge, facial swelling, hearing loss, mouth sores, nosebleeds, postnasal drip, rhinorrhea, sinus pressure, sinus pain, sneezing, tinnitus, trouble swallowing and voice change.   Respiratory: Negative.    Cardiovascular: Negative.   Gastrointestinal: Negative.      Physical Exam Triage Vital Signs ED Triage Vitals  Encounter Vitals Group     BP 02/15/24 1329 (!) 144/100     Girls Systolic BP Percentile --      Girls Diastolic  BP Percentile --      Boys Systolic BP Percentile --      Boys Diastolic BP Percentile --      Pulse Rate 02/15/24 1329 90     Resp 02/15/24 1329 20     Temp 02/15/24 1329 98.4 F (36.9 C)     Temp Source 02/15/24 1329 Oral     SpO2 02/15/24 1329 97 %     Weight --      Height --      Head Circumference --      Peak Flow --      Pain Score 02/15/24 1331 2     Pain Loc --      Pain Education --      Exclude from Growth Chart --    No data found.  Updated Vital Signs BP (!) 144/100 (BP Location: Left Arm)   Pulse 90   Temp 98.4 F (36.9  C) (Oral)   Resp 20   SpO2 97%   Visual Acuity Right Eye Distance:   Left Eye Distance:   Bilateral Distance:    Right Eye Near:   Left Eye Near:    Bilateral Near:     Physical Exam Constitutional:      Appearance: Normal appearance.  HENT:     Right Ear: Tympanic membrane, ear canal and external ear normal.     Left Ear: Tympanic membrane, ear canal and external ear normal.     Nose: Congestion present.     Mouth/Throat:     Pharynx: Posterior oropharyngeal erythema present. No oropharyngeal exudate.  Eyes:     Extraocular Movements: Extraocular movements intact.  Cardiovascular:     Rate and Rhythm: Normal rate and regular rhythm.     Pulses: Normal pulses.     Heart sounds: Normal heart sounds.  Pulmonary:     Effort: Pulmonary effort is normal.     Breath sounds: Normal breath sounds.  Musculoskeletal:     Cervical back: Normal range of motion and neck supple.  Neurological:     Mental Status: He is alert and oriented to person, place, and time. Mental status is at baseline.      UC Treatments / Results  Labs (all labs ordered are listed, but only abnormal results are displayed) Labs Reviewed  POC COVID19/FLU A&B COMBO - Abnormal; Notable for the following components:      Result Value   Influenza B Antigen, POC Positive (*)    All other components within normal limits  POCT RAPID STREP A (OFFICE) - Normal    EKG   Radiology No results found.  Procedures Procedures (including critical care time)  Medications Ordered in UC Medications - No data to display  Initial Impression / Assessment and Plan / UC Course  I have reviewed the triage vital signs and the nursing notes.  Pertinent labs & imaging results that were available during my care of the patient were reviewed by me and considered in my medical decision making (see chart for details).  Influenza B, viral illness  Patient is in no signs of distress nor toxic appearing.  Vital signs are  stable.  Low suspicion for pneumonia, pneumothorax or bronchitis and therefore will defer imaging.  Prescribed Tamiflu , prednisone  and viscous lidocaine , discussed administration and discussed quarantining if feverish. May use additional over-the-counter medications as needed for supportive care.  May follow-up with urgent care as needed if symptoms persist or worsen.   Final Clinical Impressions(s) / UC Diagnoses  Final diagnoses:  Viral illness   Discharge Instructions   None    ED Prescriptions   None    PDMP not reviewed this encounter.     [1]  Social History Tobacco Use   Smoking status: Never   Smokeless tobacco: Never  Vaping Use   Vaping status: Never Used  Substance Use Topics   Alcohol use: Not Currently    Alcohol/week: 0.0 standard drinks of alcohol   Drug use: Never     Teresa Shelba SAUNDERS, NP 02/15/24 1428  "
